# Patient Record
Sex: Female | Born: 1990
Health system: Southern US, Community
[De-identification: ages and names within clinical notes are randomized; demographics above are authoritative.]

## PROBLEM LIST (undated history)

## (undated) DIAGNOSIS — N979 Female infertility, unspecified: Secondary | ICD-10-CM

## (undated) DIAGNOSIS — E282 Polycystic ovarian syndrome: Secondary | ICD-10-CM

## (undated) DIAGNOSIS — J45909 Unspecified asthma, uncomplicated: Secondary | ICD-10-CM

## (undated) DIAGNOSIS — T883XXA Malignant hyperthermia due to anesthesia, initial encounter: Secondary | ICD-10-CM

## (undated) DIAGNOSIS — Z8489 Family history of other specified conditions: Secondary | ICD-10-CM

## (undated) DIAGNOSIS — M419 Scoliosis, unspecified: Secondary | ICD-10-CM

## (undated) DIAGNOSIS — Z87442 Personal history of urinary calculi: Secondary | ICD-10-CM

## (undated) DIAGNOSIS — Z8759 Personal history of other complications of pregnancy, childbirth and the puerperium: Secondary | ICD-10-CM

## (undated) DIAGNOSIS — J189 Pneumonia, unspecified organism: Secondary | ICD-10-CM

## (undated) HISTORY — DX: Malignant hyperthermia due to anesthesia, initial encounter: T88.3XXA

## (undated) HISTORY — DX: Female infertility, unspecified: N97.9

## (undated) HISTORY — PX: POLYPECTOMY: SHX149

## (undated) HISTORY — PX: WISDOM TOOTH EXTRACTION: SHX21

## (undated) HISTORY — DX: Pneumonia, unspecified organism: J18.9

## (undated) HISTORY — DX: Family history of other specified conditions: Z84.89

## (undated) HISTORY — DX: Scoliosis, unspecified: M41.9

---
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
## 2011-04-12 ENCOUNTER — Ambulatory Visit (INDEPENDENT_AMBULATORY_CARE_PROVIDER_SITE_OTHER): Payer: 59 | Admitting: Anesthesiology

## 2011-04-12 DIAGNOSIS — Z23 Encounter for immunization: Secondary | ICD-10-CM

## 2011-06-08 ENCOUNTER — Ambulatory Visit (INDEPENDENT_AMBULATORY_CARE_PROVIDER_SITE_OTHER): Payer: 59 | Admitting: Women's Health

## 2011-06-08 ENCOUNTER — Encounter: Payer: Self-pay | Admitting: Women's Health

## 2011-06-08 DIAGNOSIS — N766 Ulceration of vulva: Secondary | ICD-10-CM

## 2011-06-08 DIAGNOSIS — N390 Urinary tract infection, site not specified: Secondary | ICD-10-CM

## 2011-06-08 DIAGNOSIS — R112 Nausea with vomiting, unspecified: Secondary | ICD-10-CM

## 2011-06-08 DIAGNOSIS — J111 Influenza due to unidentified influenza virus with other respiratory manifestations: Secondary | ICD-10-CM

## 2011-06-08 DIAGNOSIS — N949 Unspecified condition associated with female genital organs and menstrual cycle: Secondary | ICD-10-CM

## 2011-06-08 DIAGNOSIS — R102 Pelvic and perineal pain: Secondary | ICD-10-CM

## 2011-06-08 LAB — URINALYSIS W MICROSCOPIC + REFLEX CULTURE
Casts: NONE SEEN
Crystals: NONE SEEN
Glucose, UA: NEGATIVE mg/dL
Nitrite: NEGATIVE
Specific Gravity, Urine: 1.03 (ref 1.005–1.030)
pH: 5.5 (ref 5.0–8.0)

## 2011-06-08 LAB — WET PREP FOR TRICH, YEAST, CLUE: Yeast Wet Prep HPF POC: NONE SEEN

## 2011-06-08 MED ORDER — PROMETHAZINE HCL 25 MG PO TABS: 25.0000 mg | ORAL_TABLET | Freq: Four times a day (QID) | ORAL | Status: AC | PRN

## 2011-06-08 MED ORDER — SULFAMETHOXAZOLE-TRIMETHOPRIM 400-80 MG PO TABS: 1.0000 | ORAL_TABLET | Freq: Two times a day (BID) | ORAL | Status: AC

## 2011-06-08 MED ORDER — OSELTAMIVIR PHOSPHATE 75 MG PO CAPS: 75.0000 mg | ORAL_CAPSULE | Freq: Two times a day (BID) | ORAL | Status: AC

## 2011-06-08 NOTE — Progress Notes (Signed)
Patient ID: LYZA HOUSEWORTH, female   DOB: [birth year], 21 y.o.   MRN: [id number] Presents with numerous symptoms. States is having pain and burning with urination for 2 days. Denies any pain at the end of the stream. Also states has a sore bump on the labia. Nausea and vomiting, fever, cough, headache and generally feeling poor for 2 days. One partner first for both. Contraceptives on Loestrin 1/20 without a problem.  Exam: Appears ill, external genitalia has a small ulcerated area on the left labia -HSV cultures taken and is pending. Hymenal tag, speculum exam  moderate amount of  white discharge without an odor or vaginal erythema, wet prep negative. Bimanual no CMT or adnexal fullness or tenderness.  UA positive for 3-6 WBCs, 7-10 RBCs, few bacteria.  Flu Questionable HSV lesion UTI  Plan: Tamiflu 75 by mouth twice a day for  5 days, Septra DS one by mouth twice a day for 3 days, Phenergan 25 every 8 hours when necessary for nausea precautions were reviewed. Reviewed importance of increasing rest, clear fluids, Motrin as needed for fever, call if no relief in several days. Urine culture pending.

## 2011-06-13 ENCOUNTER — Telehealth: Payer: Self-pay | Admitting: Women's Health

## 2011-06-13 DIAGNOSIS — B009 Herpesviral infection, unspecified: Secondary | ICD-10-CM

## 2011-06-13 MED ORDER — VALACYCLOVIR HCL 500 MG PO TABS: 500.0000 mg | ORAL_TABLET | Freq: Two times a day (BID) | ORAL | Status: AC

## 2011-06-13 NOTE — Telephone Encounter (Signed)
Telephone call to review HSV culture positive. First partner both, encourage to review with partner to be sure first partner. Reviewed medication Valtrex to take twice daily if ever feels like she is getting an outbreak. Instructed to call if questions or problems.

## 2011-06-14 ENCOUNTER — Telehealth: Payer: Self-pay | Admitting: Women's Health

## 2011-06-14 NOTE — Telephone Encounter (Signed)
Telephone call from patient. Partner had told her she was his first partner but after reviewing positive HSV results patient states partner has had one other partner. Instructed for him to have a STD screen or for her to return to the office for STD screening.

## 2011-08-10 ENCOUNTER — Other Ambulatory Visit: Payer: Self-pay | Admitting: Women's Health

## 2011-09-27 ENCOUNTER — Telehealth: Payer: Self-pay | Admitting: *Deleted

## 2011-09-27 ENCOUNTER — Encounter: Payer: Self-pay | Admitting: Women's Health

## 2011-09-27 ENCOUNTER — Ambulatory Visit (INDEPENDENT_AMBULATORY_CARE_PROVIDER_SITE_OTHER): Payer: 59 | Admitting: Women's Health

## 2011-09-27 VITALS — BP 100/70 | Ht 66.75 in | Wt 136.0 lb

## 2011-09-27 DIAGNOSIS — IMO0001 Reserved for inherently not codable concepts without codable children: Secondary | ICD-10-CM

## 2011-09-27 DIAGNOSIS — Z01419 Encounter for gynecological examination (general) (routine) without abnormal findings: Secondary | ICD-10-CM

## 2011-09-27 DIAGNOSIS — Z309 Encounter for contraceptive management, unspecified: Secondary | ICD-10-CM

## 2011-09-27 MED ORDER — NORETHINDRONE ACET-ETHINYL EST 1-20 MG-MCG PO TABS: 1.0000 | ORAL_TABLET | Freq: Every day | ORAL | Status: DC

## 2011-09-27 NOTE — Telephone Encounter (Signed)
Telephone call will check Costs, and decide if she wants to pursue. Also reviewed importance of getting enough rest, healthy diet to prevent illness

## 2011-09-27 NOTE — Telephone Encounter (Signed)
Pt was seen today and given Pneumovax vaccine Rx, pt said insurance doesn't cover the medication. Pt would like to know what should she do next? Please advise

## 2011-09-27 NOTE — Patient Instructions (Signed)
Health Maintenance, 20- to 21-Year-Old SCHOOL PERFORMANCE After high school completion, the Ayeisha Lindenberger adult may be attending college, technical or vocational school, or entering the military or the work force. SOCIAL AND EMOTIONAL DEVELOPMENT The Cathy Ropp adult establishes adult relationships and explores sexual identity. Jaecob Lowden adults may be living at home or in a college dorm or apartment. Increasing independence is important with Kasch Borquez adults. Throughout adolescence, teens should assume responsibility of their own health care. IMMUNIZATIONS Most Esaw Knippel adults should be fully vaccinated. A booster dose of Tdap (tetanus, diphtheria, and pertussis, or "whooping cough"), a dose of meningococcal vaccine to protect against a certain type of bacterial meningitis, hepatitis A, human papillomarvirus (HPV), chickenpox, or measles vaccines may be indicated, if not given at an earlier age. Annual influenza or "flu" vaccination should be considered during flu season.  TESTING Annual screening for vision and hearing problems is recommended. Vision should be screened objectively at least once between 20 and 21 years of age. The Jentzen Minasyan adult may be screened for anemia or tuberculosis. Matelyn Antonelli adults should have a blood test to check for high cholesterol during this time period. Tayquan Gassman adults should be screened for use of alcohol and drugs. If the Rayvin Abid adult is sexually active, screening for sexually transmitted infections, pregnancy, or HIV may be performed. Screening for cervical cancer should be performed within 3 years of beginning sexual activity. NUTRITION AND ORAL HEALTH  Adequate calcium intake is important. Consume 3 servings of low-fat milk and dairy products daily. For those who do not drink milk or consume dairy products, calcium enriched foods, such as juice, bread, or cereal, dark, leafy greens, or canned fish are alternate sources of calcium.   Drink plenty of water. Limit fruit juice to 8 to 12 ounces per day.  Avoid sugary beverages or sodas.   Discourage skipping meals, especially breakfast. Teens should eat a good variety of vegetables and fruits, as well as lean meats.   Avoid high fat, high salt, and high sugar foods, such as candy, chips, and cookies.   Encourage Deslyn Cavenaugh adults to participate in meal planning and preparation.   Eat meals together as a family whenever possible. Encourage conversation at mealtime.   Limit fast food choices and eating out at restaurants.   Brush teeth twice a day and floss.   Schedule dental exams twice a year.  SLEEP Regular sleep habits are important. PHYSICAL, SOCIAL, AND EMOTIONAL DEVELOPMENT  One hour of regular physical activity daily is recommended. Continue to participate in sports.   Encourage Lavance Beazer adults to develop their own interests and consider community service or volunteerism.   Provide guidance to the Kerri Asche adult in making decisions about college and work plans.   Make sure that Zoe Goonan adults know that they should never be in a situation that makes them uncomfortable, and they should tell partners if they do not want to engage in sexual activity.   Talk to the Brittan Mapel adult about body image. Eating disorders may be noted at this time. Kastiel Simonian adults may also be concerned about being overweight. Monitor the Jeffrie Lofstrom adult for weight gain or loss.   Mood disturbances, depression, anxiety, alcoholism, or attention problems may be noted in Yossef Gilkison adults. Talk to the caregiver if there are concerns about mental illness.   Negotiate limit setting and independent decision making.   Encourage the Vernelle Wisner adult to handle conflict without physical violence.   Avoid loud noises which may impair hearing.   Limit television and computer time to 2 hours per day.   Individuals who engage in excessive sedentary activity are more likely to become overweight.  RISK BEHAVIORS  Sexually active Norman Bier adults need to take precautions against pregnancy and sexually  transmitted infections. Talk to Zaxton Angerer adults about contraception.   Provide a tobacco-free and drug-free environment for the Domingue Coltrain adult. Talk to the Braxston Quinter adult about drug, tobacco, and alcohol use among friends or at friends' homes. Make sure the Reggie Welge adult knows that smoking tobacco or marijuana and taking drugs have health consequences and may impact brain development.   Teach the Fallou Hulbert adult about appropriate use of over-the-counter or prescription medicines.   Establish guidelines for driving and for riding with friends.   Talk to Attikus Bartoszek adults about the risks of drinking and driving or boating. Encourage the Jeffie Widdowson adult to call you if he or she or friends have been drinking or using drugs.   Remind Bronsen Serano adults to wear seat belts at all times in cars and life vests in boats.   Tamya Denardo adults should always wear a properly fitted helmet when they are riding a bicycle.   Use caution with all-terrain vehicles (ATVs) or other motorized vehicles.   Do not keep handguns in the home. (If you do, the gun and ammunition should be locked separately and out of the Zoe Goonan adult's access.)   Equip your home with smoke detectors and change the batteries regularly. Make sure all family members know the fire escape plans for your home.   Teach Leonila Speranza adults not to swim alone and not to dive in shallow water.   All individuals should wear sunscreen that protects against UVA and UVB light with at least a sun protection factor (SPF) of 30 when out in the sun. This minimizes sun burning.  WHAT'S NEXT? Mikkel Charrette adults should visit their pediatrician or family physician yearly. By Arron Tetrault adulthood, health care should be transitioned to a family physician or internal medicine specialist. Sexually active females may want to begin annual physical exams with a gynecologist. Document Released: 07/19/2006 Document Revised: 04/12/2011 Document Reviewed: 08/08/2006 ExitCare Patient Information 2012 [employer]. 

## 2011-09-27 NOTE — Progress Notes (Signed)
Felicia Woodard [birth year] [id number]    History:    The patient presents for annual exam.  Regular monthly 4 days cycle on Loestrin 1/20. Same partner, gardasil series completed in 2012.   Past medical history, past surgical history, family history and social history were all reviewed and documented in the EPIC chart. 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 Had pneumonia January of 2013. History of kidney stone, has mild scoliosis.   ROS:  A  ROS was performed and pertinent positives and negatives are included in the history.  Exam:  There were no vitals filed for this visit.  General appearance:  Normal Head/Neck:  Normal, without cervical or supraclavicular adenopathy. Thyroid:  Symmetrical, normal in size, without palpable masses or nodularity. Respiratory  Effort:  Normal  Auscultation:  Clear without wheezing or rhonchi Cardiovascular  Auscultation:  Regular rate, without rubs, murmurs or gallops  Edema/varicosities:  Not grossly evident Abdominal  Soft,nontender, without masses, guarding or rebound.  Liver/spleen:  No organomegaly noted  Hernia:  None appreciated  Skin  Inspection:  Grossly normal  Palpation:  Grossly normal Neurologic/psychiatric  Orientation:  Normal with appropriate conversation.  Mood/affect:  Normal  Genitourinary    Breasts: Examined lying and sitting.     Right: Without masses, retractions, discharge or axillary adenopathy.     Left: Without masses, retractions, discharge or axillary adenopathy.   Inguinal/mons:  Normal without inguinal adenopathy  External genitalia:  Normal  BUS/Urethra/Skene's glands:  Normal  Bladder:  Normal  Vagina:  Normal  Cervix:  Normal  Uterus:  normal in size, shape and contour.  Midline and mobile  Adnexa/parametria:     Rt: Without masses or tenderness.   Lt: Without masses or tenderness.  Anus and perineum: Normal  Digital rectal exam:   Assessment/Plan:  21 y.o. SWF G0  for annual exam with  no complaints.     Normal GYN exam on Loestrin 1/20  Plan: Loestrin 1/20 prescription, proper use, slight risk for blood clots and strokes reviewed, condoms encouraged until permanent partner. SBE's, exercise, calcium rich diet, MVI daily encouraged. Pneumovax vaccine reviewed in light of pneumonia history January 2013. Will followup with primary care. CBC, UAHarrington Challenger WHNP, 12:07 PM 09/27/2011

## 2011-09-28 LAB — CBC WITH DIFFERENTIAL/PLATELET
Basophils Absolute: 0.1 10*3/uL (ref 0.0–0.1)
Basophils Relative: 1 % (ref 0–1)
Eosinophils Absolute: 0.2 10*3/uL (ref 0.0–0.7)
Eosinophils Relative: 4 % (ref 0–5)
HCT: 41.4 % (ref 36.0–46.0)
Hemoglobin: 14.2 g/dL (ref 12.0–15.0)
Lymphocytes Relative: 33 % (ref 12–46)
Lymphs Abs: 2.1 10*3/uL (ref 0.7–4.0)
MCH: 30.2 pg (ref 26.0–34.0)
MCV: 88.1 fL (ref 78.0–100.0)
Monocytes Relative: 5 % (ref 3–12)
Platelets: 349 10*3/uL (ref 150–400)
RBC: 4.7 MIL/uL (ref 3.87–5.11)
WBC: 6.4 10*3/uL (ref 4.0–10.5)

## 2011-09-28 LAB — URINALYSIS W MICROSCOPIC + REFLEX CULTURE
Bacteria, UA: NONE SEEN
Bilirubin Urine: NEGATIVE
Protein, ur: NEGATIVE mg/dL
Squamous Epithelial / LPF: NONE SEEN
Urobilinogen, UA: 0.2 mg/dL (ref 0.0–1.0)

## 2011-10-11 ENCOUNTER — Other Ambulatory Visit: Payer: Self-pay | Admitting: Women's Health

## 2012-10-01 ENCOUNTER — Ambulatory Visit (INDEPENDENT_AMBULATORY_CARE_PROVIDER_SITE_OTHER): Payer: 59 | Admitting: Women's Health

## 2012-10-01 ENCOUNTER — Other Ambulatory Visit (HOSPITAL_COMMUNITY)
Admission: RE | Admit: 2012-10-01 | Discharge: 2012-10-01 | Disposition: A | Discharge status: Home / Self Care | Payer: 59 | Source: Ambulatory Visit | Attending: Women's Health | Admitting: Women's Health

## 2012-10-01 ENCOUNTER — Encounter: Payer: Self-pay | Admitting: Women's Health

## 2012-10-01 VITALS — BP 116/64 | Ht 67.25 in | Wt 147.0 lb

## 2012-10-01 DIAGNOSIS — Z01419 Encounter for gynecological examination (general) (routine) without abnormal findings: Secondary | ICD-10-CM | POA: Insufficient documentation

## 2012-10-01 DIAGNOSIS — Z309 Encounter for contraceptive management, unspecified: Secondary | ICD-10-CM

## 2012-10-01 DIAGNOSIS — IMO0001 Reserved for inherently not codable concepts without codable children: Secondary | ICD-10-CM

## 2012-10-01 LAB — CBC WITH DIFFERENTIAL/PLATELET
Hemoglobin: 13.2 g/dL (ref 12.0–15.0)
Lymphocytes Relative: 29 % (ref 12–46)
Lymphs Abs: 1.4 10*3/uL (ref 0.7–4.0)
MCH: 30.3 pg (ref 26.0–34.0)
Monocytes Relative: 8 % (ref 3–12)
Neutro Abs: 2.9 10*3/uL (ref 1.7–7.7)
Neutrophils Relative %: 59 % (ref 43–77)
RBC: 4.36 MIL/uL (ref 3.87–5.11)
WBC: 4.9 10*3/uL (ref 4.0–10.5)

## 2012-10-01 MED ORDER — NORETHINDRONE ACET-ETHINYL EST 1-20 MG-MCG PO TABS: 1.0000 | ORAL_TABLET | Freq: Every day | ORAL | Status: DC

## 2012-10-01 NOTE — Progress Notes (Signed)
Felicia Woodard 04-17-1991 [id number]    History:    The patient presents for annual exam.  Contraceptives on Loestrin 1/20 without problem. Gardasil series completed 2012. History of pneumonia February 2013, was hospitalized. History of a kidney stone.   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 Getting married next week, fiance youth minister.    ROS:  A  ROS was performed and pertinent positives and negatives are included in the history.  Exam:  Filed Vitals:   10/01/12 1024  BP: 116/64    General appearance:  Normal Head/Neck:  Normal, without cervical or supraclavicular adenopathy. Thyroid:  Symmetrical, normal in size, without palpable masses or nodularity. Respiratory  Effort:  Normal  Auscultation:  Clear without wheezing or rhonchi Cardiovascular  Auscultation:  Regular rate, without rubs, murmurs or gallops  Edema/varicosities:  Not grossly evident Abdominal  Soft,nontender, without masses, guarding or rebound.  Liver/spleen:  No organomegaly noted  Hernia:  None appreciated  Skin  Inspection:  Grossly normal  Palpation:  Grossly normal Neurologic/psychiatric  Orientation:  Normal with appropriate conversation.  Mood/affect:  Normal  Genitourinary    Breasts: Examined lying and sitting.     Right: Without masses, retractions, discharge or axillary adenopathy.     Left: Without masses, retractions, discharge or axillary adenopathy.   Inguinal/mons:  Normal without inguinal adenopathy  External genitalia:  Normal  BUS/Urethra/Skene's glands:  Normal  Bladder:  Normal  Vagina:  Normal  Cervix:  Normal  Uterus:   normal in size, shape and contour.  Midline and mobile  Adnexa/parametria:     Rt: Without masses or tenderness.   Lt: Without masses or tenderness.  Anus and  perineum: Normal   Assessment/Plan:  22 y.o. SWF G0 for annual exam with no complaints.  Normal GYN exam on OC's  Plan: Loestrin 1/20 prescription, proper use, slight risk for blood clots and strokes reviewed. SBE's, regular exercise, calcium rich diet, MVI daily encouraged. CBC, UA, Pap.     Harrington Challenger WHNP, 11:12 AM 10/01/2012

## 2012-10-01 NOTE — Addendum Note (Signed)
Addended by: Richardson Chiquito on: 10/01/2012 11:23 AM   Modules accepted: Orders

## 2012-10-01 NOTE — Patient Instructions (Addendum)
Health Maintenance, 18- to 21-Year-Old SCHOOL PERFORMANCE After high school completion, the young adult may be attending college, technical or vocational school, or entering the military or the work force. SOCIAL AND EMOTIONAL DEVELOPMENT The young adult establishes adult relationships and explores sexual identity. Young adults may be living at home or in a college dorm or apartment. Increasing independence is important with young adults. Throughout adolescence, teens should assume responsibility of their own health care. IMMUNIZATIONS Most young adults should be fully vaccinated. A booster dose of Tdap (tetanus, diphtheria, and pertussis, or "whooping cough"), a dose of meningococcal vaccine to protect against a certain type of bacterial meningitis, hepatitis A, human papillomarvirus (HPV), chickenpox, or measles vaccines may be indicated, if not given at an earlier age. Annual influenza or "flu" vaccination should be considered during flu season.  TESTING Annual screening for vision and hearing problems is recommended. Vision should be screened objectively at least once between 18 and 21 years of age. The young adult may be screened for anemia or tuberculosis. Young adults should have a blood test to check for high cholesterol during this time period. Young adults should be screened for use of alcohol and drugs. If the young adult is sexually active, screening for sexually transmitted infections, pregnancy, or HIV may be performed. Screening for cervical cancer should be performed within 3 years of beginning sexual activity. NUTRITION AND ORAL HEALTH  Adequate calcium intake is important. Consume 3 servings of low-fat milk and dairy products daily. For those who do not drink milk or consume dairy products, calcium enriched foods, such as juice, bread, or cereal, dark, leafy greens, or canned fish are alternate sources of calcium.  Drink plenty of water. Limit fruit juice to 8 to 12 ounces per day.  Avoid sugary beverages or sodas.  Discourage skipping meals, especially breakfast. Teens should eat a good variety of vegetables and fruits, as well as lean meats.  Avoid high fat, high salt, and high sugar foods, such as candy, chips, and cookies.  Encourage young adults to participate in meal planning and preparation.  Eat meals together as a family whenever possible. Encourage conversation at mealtime.  Limit fast food choices and eating out at restaurants.  Brush teeth twice a day and floss.  Schedule dental exams twice a year. SLEEP Regular sleep habits are important. PHYSICAL, SOCIAL, AND EMOTIONAL DEVELOPMENT  One hour of regular physical activity daily is recommended. Continue to participate in sports.  Encourage young adults to develop their own interests and consider community service or volunteerism.  Provide guidance to the young adult in making decisions about college and work plans.  Make sure that young adults know that they should never be in a situation that makes them uncomfortable, and they should tell partners if they do not want to engage in sexual activity.  Talk to the young adult about body image. Eating disorders may be noted at this time. Young adults may also be concerned about being overweight. Monitor the young adult for weight gain or loss.  Mood disturbances, depression, anxiety, alcoholism, or attention problems may be noted in young adults. Talk to the caregiver if there are concerns about mental illness.  Negotiate limit setting and independent decision making.  Encourage the young adult to handle conflict without physical violence.  Avoid loud noises which may impair hearing.  Limit television and computer time to 2 hours per day. Individuals who engage in excessive sedentary activity are more likely to become overweight. RISK BEHAVIORS  Sexually active   young adults need to take precautions against pregnancy and sexually transmitted  infections. Talk to young adults about contraception.  Provide a tobacco-free and drug-free environment for the young adult. Talk to the young adult about drug, tobacco, and alcohol use among friends or at friends' homes. Make sure the young adult knows that smoking tobacco or marijuana and taking drugs have health consequences and may impact brain development.  Teach the young adult about appropriate use of over-the-counter or prescription medicines.  Establish guidelines for driving and for riding with friends.  Talk to young adults about the risks of drinking and driving or boating. Encourage the young adult to call you if he or she or friends have been drinking or using drugs.  Remind young adults to wear seat belts at all times in cars and life vests in boats.  Young adults should always wear a properly fitted helmet when they are riding a bicycle.  Use caution with all-terrain vehicles (ATVs) or other motorized vehicles.  Do not keep handguns in the home. (If you do, the gun and ammunition should be locked separately and out of the young adult's access.)  Equip your home with smoke detectors and change the batteries regularly. Make sure all family members know the fire escape plans for your home.  Teach young adults not to swim alone and not to dive in shallow water.  All individuals should wear sunscreen that protects against UVA and UVB light with at least a sun protection factor (SPF) of 30 when out in the sun. This minimizes sun burning. WHAT'S NEXT? Young adults should visit their pediatrician or family physician yearly. By young adulthood, health care should be transitioned to a family physician or internal medicine specialist. Sexually active females may want to begin annual physical exams with a gynecologist. Document Released: 07/19/2006 Document Revised: 07/16/2011 Document Reviewed: 08/08/2006 ExitCare Patient Information 2014 [employer].  

## 2012-10-02 LAB — URINALYSIS W MICROSCOPIC + REFLEX CULTURE
Casts: NONE SEEN
Glucose, UA: NEGATIVE mg/dL
Leukocytes, UA: NEGATIVE
Nitrite: NEGATIVE
Specific Gravity, Urine: 1.022 (ref 1.005–1.030)
pH: 5.5 (ref 5.0–8.0)

## 2013-08-31 ENCOUNTER — Other Ambulatory Visit: Payer: Self-pay | Admitting: Women's Health

## 2013-10-02 ENCOUNTER — Encounter: Payer: Self-pay | Admitting: Women's Health

## 2013-10-02 ENCOUNTER — Ambulatory Visit (INDEPENDENT_AMBULATORY_CARE_PROVIDER_SITE_OTHER): Payer: 59 | Admitting: Women's Health

## 2013-10-02 VITALS — BP 124/70 | Ht 66.75 in | Wt 157.0 lb

## 2013-10-02 DIAGNOSIS — A609 Anogenital herpesviral infection, unspecified: Secondary | ICD-10-CM

## 2013-10-02 DIAGNOSIS — N898 Other specified noninflammatory disorders of vagina: Secondary | ICD-10-CM

## 2013-10-02 DIAGNOSIS — R350 Frequency of micturition: Secondary | ICD-10-CM

## 2013-10-02 DIAGNOSIS — Z01419 Encounter for gynecological examination (general) (routine) without abnormal findings: Secondary | ICD-10-CM

## 2013-10-02 DIAGNOSIS — Z309 Encounter for contraceptive management, unspecified: Secondary | ICD-10-CM

## 2013-10-02 DIAGNOSIS — B3731 Acute candidiasis of vulva and vagina: Secondary | ICD-10-CM

## 2013-10-02 DIAGNOSIS — IMO0001 Reserved for inherently not codable concepts without codable children: Secondary | ICD-10-CM

## 2013-10-02 DIAGNOSIS — B009 Herpesviral infection, unspecified: Secondary | ICD-10-CM

## 2013-10-02 DIAGNOSIS — B373 Candidiasis of vulva and vagina: Secondary | ICD-10-CM

## 2013-10-02 LAB — CBC WITH DIFFERENTIAL/PLATELET
BASOS ABS: 0 10*3/uL (ref 0.0–0.1)
Basophils Relative: 0 % (ref 0–1)
EOS ABS: 0.1 10*3/uL (ref 0.0–0.7)
EOS PCT: 1 % (ref 0–5)
HCT: 40.7 % (ref 36.0–46.0)
Hemoglobin: 13.8 g/dL (ref 12.0–15.0)
LYMPHS PCT: 10 % — AB (ref 12–46)
Lymphs Abs: 1.1 10*3/uL (ref 0.7–4.0)
MCH: 29.5 pg (ref 26.0–34.0)
MCHC: 33.9 g/dL (ref 30.0–36.0)
MCV: 87 fL (ref 78.0–100.0)
Monocytes Absolute: 0.7 10*3/uL (ref 0.1–1.0)
Monocytes Relative: 6 % (ref 3–12)
Neutro Abs: 9.4 10*3/uL — ABNORMAL HIGH (ref 1.7–7.7)
Neutrophils Relative %: 83 % — ABNORMAL HIGH (ref 43–77)
PLATELETS: 293 10*3/uL (ref 150–400)
RBC: 4.68 MIL/uL (ref 3.87–5.11)
RDW: 13.1 % (ref 11.5–15.5)
WBC: 11.3 10*3/uL — AB (ref 4.0–10.5)

## 2013-10-02 LAB — WET PREP FOR TRICH, YEAST, CLUE
Clue Cells Wet Prep HPF POC: NONE SEEN
TRICH WET PREP: NONE SEEN

## 2013-10-02 LAB — URINALYSIS W MICROSCOPIC + REFLEX CULTURE
Casts: NONE SEEN
Crystals: NONE SEEN
GLUCOSE, UA: NEGATIVE mg/dL
Ketones, ur: 40 mg/dL — AB
Nitrite: NEGATIVE
PROTEIN: 30 mg/dL — AB
Specific Gravity, Urine: >1.03 — ABNORMAL HIGH (ref 1.005–1.030)
UROBILINOGEN UA: 0.2 mg/dL (ref 0.0–1.0)
pH: 5.5 (ref 5.0–8.0)

## 2013-10-02 MED ORDER — SULFAMETHOXAZOLE-TRIMETHOPRIM 800-160 MG PO TABS: 1.0000 | ORAL_TABLET | Freq: Two times a day (BID) | ORAL | Status: DC

## 2013-10-02 MED ORDER — FLUCONAZOLE 150 MG PO TABS: 150.0000 mg | ORAL_TABLET | Freq: Once | ORAL | Status: DC

## 2013-10-02 MED ORDER — NORETHINDRONE ACET-ETHINYL EST 1-20 MG-MCG PO TABS: ORAL_TABLET | ORAL | Status: DC

## 2013-10-02 MED ORDER — VALACYCLOVIR HCL 500 MG PO TABS: 500.0000 mg | ORAL_TABLET | Freq: Two times a day (BID) | ORAL | Status: DC

## 2013-10-02 NOTE — Progress Notes (Signed)
Felicia Woodard July 28, 1990 [id number]    History:    Presents for annual exam.  Regular monthly light cycle on Loestrin 1/20. Normal Pap history. 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 Reports upper respiratory symptoms, urinary frequency and increased vaginal discharge. States is not feeling well today. History of pneumonia 2013. Gardasil series completed.  Past medical history, past surgical history, family history and social history were all reviewed and documented in the EPIC chart. Preschool teacher.   ROS:  A  12 point ROS was performed and pertinent positives and negatives are included.  Exam:  Filed Vitals:   10/02/13 1402  BP: 124/70    General appearance:  Normal Thyroid:  Symmetrical, normal in size, without palpable masses or nodularity. Respiratory  Auscultation:  Clear without wheezing or rhonchi Cardiovascular  Auscultation:  Regular rate, without rubs, murmurs or gallops  Edema/varicosities:  Not grossly evident Abdominal  Soft,nontender, without masses, guarding or rebound.  Liver/spleen:  No organomegaly noted  Hernia:  None appreciated  Skin  Inspection:  Grossly normal   Breasts: Examined lying and sitting.     Right: Without masses, retractions, discharge or axillary adenopathy.     Left: Without masses, retractions, discharge or axillary adenopathy. Gentitourinary   Inguinal/mons:  Normal without inguinal adenopathy  External genitalia:  Normal, hymenal tag  BUS/Urethra/Skene's glands:  Normal  Vagina:  Moderate white discharge, wet prep positive for yeast  Cervix:  Normal  Uterus:   normal in size, shape and contour.  Midline and mobile  Adnexa/parametria:     Rt: Without masses or tenderness.   Lt: Without masses or tenderness.  Anus and perineum: Normal UA: Trace leukocytes, 3-6 WBCs, 11-20 rbc's, few bacteria   Assessment/Plan:  23 y.o.MWF G0  for annual exam.    Upper respiratory infection Urinary frequency/Rule out  UTI Yeast HSV-no outbreaks  Plan: Septra DS twice daily for 3 days #6. Reviewed may treat with upper respiratory and UTI. Loestrin 1/20 prescription, proper use given and reviewed slight risk for blood clots and strokes. Diflucan 150 mg, one dose with refill. Valtrex 500 twice daily for 3-5 days if needed. Prescription, proper use given and reviewed. SBE's, regular exercise, calcium rich diet, MVI daily encouraged. CBC, rubella titer, UA. Pap normal 2014, new screening guidelines reviewed.  Note: This dictation was prepared with Dragon/digital dictation.  Any transcriptional errors that result are unintentional. Harrington Challenger [facility], 2:40 PM 10/02/2013

## 2013-10-02 NOTE — Patient Instructions (Signed)

## 2013-10-03 LAB — RUBELLA SCREEN: Rubella: 2.13 Index — ABNORMAL HIGH (ref <0.90)

## 2013-10-04 LAB — URINE CULTURE: Colony Count: 5000

## 2013-10-05 ENCOUNTER — Telehealth: Payer: Self-pay

## 2013-10-05 NOTE — Telephone Encounter (Signed)
[place]  She will research and call me back with pharmacy near her there.

## 2013-10-05 NOTE — Telephone Encounter (Signed)
Message copied by Keenan Bachelor on Mon Oct 05, 2013 11:15 AM ------      Message from: [place], Harriett Sine J      Created: Mon Oct 05, 2013  9:10 AM       Please call and inform urine culture was neg, ask if feeling bette if not let me know. Inform she is rubella positive. ------

## 2013-10-05 NOTE — Telephone Encounter (Signed)
Please call in a zpak, she has history of pneumonia, review you take for 5 days but works for 10,

## 2013-10-05 NOTE — Telephone Encounter (Signed)
Per your question below.  Patient says she is some better but still has symptoms.

## 2014-01-26 ENCOUNTER — Other Ambulatory Visit: Payer: Self-pay

## 2014-01-26 MED ORDER — NORETHINDRONE ACET-ETHINYL EST 1-20 MG-MCG PO TABS: ORAL_TABLET | ORAL | Status: DC

## 2014-09-29 ENCOUNTER — Telehealth: Payer: Self-pay | Admitting: *Deleted

## 2014-09-29 NOTE — Telephone Encounter (Signed)
Have her take a home U PT, schedule annual exam prior to moving.

## 2014-09-29 NOTE — Telephone Encounter (Signed)
Left the below on pt voicemail and told her per Lupita LeashDonna she can come on 10/01/14 at 2:00pm. Told her to call to confirm this with donna

## 2014-09-29 NOTE — Telephone Encounter (Signed)
Pt called stating her last LMP was in Jan 2016, stopped birth control pills in Nov. 2015. Is sexually active has not taken a UPT, states sometimes she has cramping but never a bleed. Pt annual is due this month, however she will be moving next month. Please advise

## 2014-10-01 ENCOUNTER — Encounter: Payer: Self-pay | Admitting: Women's Health

## 2014-10-08 ENCOUNTER — Encounter: Payer: Self-pay | Admitting: Women's Health

## 2014-10-08 ENCOUNTER — Ambulatory Visit (INDEPENDENT_AMBULATORY_CARE_PROVIDER_SITE_OTHER): Payer: BLUE CROSS/BLUE SHIELD | Admitting: Women's Health

## 2014-10-08 VITALS — BP 120/72 | Ht 67.0 in | Wt 170.4 lb

## 2014-10-08 DIAGNOSIS — Z01419 Encounter for gynecological examination (general) (routine) without abnormal findings: Secondary | ICD-10-CM | POA: Diagnosis not present

## 2014-10-08 LAB — CBC WITH DIFFERENTIAL/PLATELET
BASOS ABS: 0.1 10*3/uL (ref 0.0–0.1)
BASOS PCT: 1 % (ref 0–1)
EOS PCT: 3 % (ref 0–5)
Eosinophils Absolute: 0.2 10*3/uL (ref 0.0–0.7)
HEMATOCRIT: 41.5 % (ref 36.0–46.0)
HEMOGLOBIN: 14.2 g/dL (ref 12.0–15.0)
LYMPHS ABS: 2.9 10*3/uL (ref 0.7–4.0)
LYMPHS PCT: 37 % (ref 12–46)
MCH: 30.2 pg (ref 26.0–34.0)
MCHC: 34.2 g/dL (ref 30.0–36.0)
MCV: 88.3 fL (ref 78.0–100.0)
MONOS PCT: 7 % (ref 3–12)
MPV: 9.1 fL (ref 8.6–12.4)
Monocytes Absolute: 0.6 10*3/uL (ref 0.1–1.0)
Neutro Abs: 4.1 10*3/uL (ref 1.7–7.7)
Neutrophils Relative %: 52 % (ref 43–77)
Platelets: 339 10*3/uL (ref 150–400)
RBC: 4.7 MIL/uL (ref 3.87–5.11)
RDW: 12.6 % (ref 11.5–15.5)
WBC: 7.9 10*3/uL (ref 4.0–10.5)

## 2014-10-08 NOTE — Progress Notes (Signed)
Carmina MillerMadison D Dejarnett Oct 16, 1990 [id number]    History:    Presents for annual exam.  Regular monthly cycle until March amenorrheic until today, negative home UPT under increased stress maternal aunt died of ovarian cancer, process of moving tomorrow and changing jobs.  Normal Pap history. Gardasil series completed. HSV 2 rare outbreaks. Rubella positive.  Past medical history, past surgical history, family history and social history were all reviewed and documented in the EPIC chart. 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 2013 pneumonia, has had pneumonia vaccine. Father hypertension.  ROS:  A ROS was performed and pertinent positives and negatives are included.  Exam:  Filed Vitals:   10/08/14 1525  BP: 120/72    General appearance:  Normal Thyroid:  Symmetrical, normal in size, without palpable masses or nodularity. Respiratory  Auscultation:  Clear without wheezing or rhonchi Cardiovascular  Auscultation:  Regular rate, without rubs, murmurs or gallops  Edema/varicosities:  Not grossly evident Abdominal  Soft,nontender, without masses, guarding or rebound.  Liver/spleen:  No organomegaly noted  Hernia:  None appreciated  Skin  Inspection:  Grossly normal   Breasts: Examined lying and sitting.     Right: Without masses, retractions, discharge or axillary adenopathy.     Left: Without masses, retractions, discharge or axillary adenopathy. Gentitourinary   Inguinal/mons:  Normal without inguinal adenopathy  External genitalia:  Normal  BUS/Urethra/Skene's glands:  Normal  Vagina:  Normal  Cervix:  Normal  Uterus:   normal in size, shape and contour.  Midline and mobile  Adnexa/parametria:     Rt: Without masses or tenderness.   Lt: Without masses or tenderness.  Anus and perineum: Normal    Assessment/Plan:  24 y.o. SWF G0 for annual exam.  Monthly cycle/condoms Rubella positive HSV 2-rare outbreaks  Plan: SBE's, regular exercise, calcium  rich diet, safe pregnancy behaviors reviewed. Contemplating pregnancy next year. 15 pound weight gain, relates to frequent eating out, decreased exercise due to family commitments. CBC, UA. Pap normal 2014, new screening guidelines reviewed.   Harrington ChallengerYOUNG,Orie Baxendale J Culbers[facility] HospitalWHNP, 3:47 PM 10/08/2014

## 2014-10-08 NOTE — Patient Instructions (Signed)

## 2014-12-27 ENCOUNTER — Telehealth: Payer: Self-pay | Admitting: *Deleted

## 2014-12-27 NOTE — Telephone Encounter (Signed)
Pt called c/o no cycle since June, asked if was okay to try "something" unable to understand the message what pt wanted to try. I asked her to call me back to talk.

## 2014-12-28 NOTE — Telephone Encounter (Signed)
I called pt back and told her OV with nancy, no longer taking birth control pills, asked about an essential oil to help start cycle.

## 2015-10-20 ENCOUNTER — Ambulatory Visit (INDEPENDENT_AMBULATORY_CARE_PROVIDER_SITE_OTHER): Payer: Managed Care, Other (non HMO) | Admitting: Women's Health

## 2015-10-20 ENCOUNTER — Encounter: Payer: Self-pay | Admitting: Women's Health

## 2015-10-20 VITALS — BP 122/80 | Ht 67.0 in | Wt 160.0 lb

## 2015-10-20 DIAGNOSIS — N926 Irregular menstruation, unspecified: Secondary | ICD-10-CM | POA: Diagnosis not present

## 2015-10-20 DIAGNOSIS — Z01419 Encounter for gynecological examination (general) (routine) without abnormal findings: Secondary | ICD-10-CM | POA: Diagnosis not present

## 2015-10-20 LAB — TSH: TSH: 1.74 mIU/L

## 2015-10-20 LAB — CBC WITH DIFFERENTIAL/PLATELET
BASOS PCT: 1 %
Basophils Absolute: 58 cells/uL (ref 0–200)
Eosinophils Absolute: 174 cells/uL (ref 15–500)
Eosinophils Relative: 3 %
HEMATOCRIT: 39.4 % (ref 35.0–45.0)
HEMOGLOBIN: 13 g/dL (ref 11.7–15.5)
LYMPHS ABS: 2204 {cells}/uL (ref 850–3900)
LYMPHS PCT: 38 %
MCH: 29.7 pg (ref 27.0–33.0)
MCHC: 33 g/dL (ref 32.0–36.0)
MCV: 90.2 fL (ref 80.0–100.0)
MONO ABS: 348 {cells}/uL (ref 200–950)
MPV: 8.7 fL (ref 7.5–12.5)
Monocytes Relative: 6 %
Neutro Abs: 3016 cells/uL (ref 1500–7800)
Neutrophils Relative %: 52 %
Platelets: 332 10*3/uL (ref 140–400)
RBC: 4.37 MIL/uL (ref 3.80–5.10)
RDW: 13.1 % (ref 11.0–15.0)
WBC: 5.8 10*3/uL (ref 3.8–10.8)

## 2015-10-20 NOTE — Patient Instructions (Signed)
Health Maintenance, Female Adopting a healthy lifestyle and getting preventive care can go a long way to promote health and wellness. Talk with your health care provider about what schedule of regular examinations is right for you. This is a good chance for you to check in with your provider about disease prevention and staying healthy. In between checkups, there are plenty of things you can do on your own. Experts have done a lot of research about which lifestyle changes and preventive measures are most likely to keep you healthy. Ask your health care provider for more information. WEIGHT AND DIET  Eat a healthy diet  Be sure to include plenty of vegetables, fruits, low-fat dairy products, and lean protein.  Do not eat a lot of foods high in solid fats, added sugars, or salt.  Get regular exercise. This is one of the most important things you can do for your health.  Most adults should exercise for at least 150 minutes each week. The exercise should increase your heart rate and make you sweat (moderate-intensity exercise).  Most adults should also do strengthening exercises at least twice a week. This is in addition to the moderate-intensity exercise.  Maintain a healthy weight  Body mass index (BMI) is a measurement that can be used to identify possible weight problems. It estimates body fat based on height and weight. Your health care provider can help determine your BMI and help you achieve or maintain a healthy weight.  For females 20 years of age and older:   A BMI below 18.5 is considered underweight.  A BMI of 18.5 to 24.9 is normal.  A BMI of 25 to 29.9 is considered overweight.  A BMI of 30 and above is considered obese.  Watch levels of cholesterol and blood lipids  You should start having your blood tested for lipids and cholesterol at 24 years of age, then have this test every 5 years.  You may need to have your cholesterol levels checked more often if:  Your lipid  or cholesterol levels are high.  You are older than 24 years of age.  You are at high risk for heart disease.  CANCER SCREENING   Lung Cancer  Lung cancer screening is recommended for adults 55-80 years old who are at high risk for lung cancer because of a history of smoking.  A yearly low-dose CT scan of the lungs is recommended for people who:  Currently smoke.  Have quit within the past 15 years.  Have at least a 30-pack-year history of smoking. A pack year is smoking an average of one pack of cigarettes a day for 1 year.  Yearly screening should continue until it has been 15 years since you quit.  Yearly screening should stop if you develop a health problem that would prevent you from having lung cancer treatment.  Breast Cancer  Practice breast self-awareness. This means understanding how your breasts normally appear and feel.  It also means doing regular breast self-exams. Let your health care provider know about any changes, no matter how small.  If you are in your 20s or 30s, you should have a clinical breast exam (CBE) by a health care provider every 1-3 years as part of a regular health exam.  If you are 40 or older, have a CBE every year. Also consider having a breast X-ray (mammogram) every year.  If you have a family history of breast cancer, talk to your health care provider about genetic screening.  If you   are at high risk for breast cancer, talk to your health care provider about having an MRI and a mammogram every year.  Breast cancer gene (BRCA) assessment is recommended for women who have family members with BRCA-related cancers. BRCA-related cancers include:  Breast.  Ovarian.  Tubal.  Peritoneal cancers.  Results of the assessment will determine the need for genetic counseling and BRCA1 and BRCA2 testing. Cervical Cancer Your health care provider may recommend that you be screened regularly for cancer of the pelvic organs (ovaries, uterus, and  vagina). This screening involves a pelvic examination, including checking for microscopic changes to the surface of your cervix (Pap test). You may be encouraged to have this screening done every 3 years, beginning at age 21.  For women ages 30-65, health care providers may recommend pelvic exams and Pap testing every 3 years, or they may recommend the Pap and pelvic exam, combined with testing for human papilloma virus (HPV), every 5 years. Some types of HPV increase your risk of cervical cancer. Testing for HPV may also be done on women of any age with unclear Pap test results.  Other health care providers may not recommend any screening for nonpregnant women who are considered low risk for pelvic cancer and who do not have symptoms. Ask your health care provider if a screening pelvic exam is right for you.  If you have had past treatment for cervical cancer or a condition that could lead to cancer, you need Pap tests and screening for cancer for at least 20 years after your treatment. If Pap tests have been discontinued, your risk factors (such as having a new sexual partner) need to be reassessed to determine if screening should resume. Some women have medical problems that increase the chance of getting cervical cancer. In these cases, your health care provider may recommend more frequent screening and Pap tests. Colorectal Cancer  This type of cancer can be detected and often prevented.  Routine colorectal cancer screening usually begins at 24 years of age and continues through 24 years of age.  Your health care provider may recommend screening at an earlier age if you have risk factors for colon cancer.  Your health care provider may also recommend using home test kits to check for hidden blood in the stool.  A small camera at the end of a tube can be used to examine your colon directly (sigmoidoscopy or colonoscopy). This is done to check for the earliest forms of colorectal  cancer.  Routine screening usually begins at age 50.  Direct examination of the colon should be repeated every 5-10 years through 24 years of age. However, you may need to be screened more often if early forms of precancerous polyps or small growths are found. Skin Cancer  Check your skin from head to toe regularly.  Tell your health care provider about any new moles or changes in moles, especially if there is a change in a mole's shape or color.  Also tell your health care provider if you have a mole that is larger than the size of a pencil eraser.  Always use sunscreen. Apply sunscreen liberally and repeatedly throughout the day.  Protect yourself by wearing long sleeves, pants, a wide-brimmed hat, and sunglasses whenever you are outside. HEART DISEASE, DIABETES, AND HIGH BLOOD PRESSURE   High blood pressure causes heart disease and increases the risk of stroke. High blood pressure is more likely to develop in:  People who have blood pressure in the high end   of the normal range (130-139/85-89 mm Hg).  People who are overweight or obese.  People who are African American.  If you are 38-23 years of age, have your blood pressure checked every 3-5 years. If you are 61 years of age or older, have your blood pressure checked every year. You should have your blood pressure measured twice--once when you are at a hospital or clinic, and once when you are not at a hospital or clinic. Record the average of the two measurements. To check your blood pressure when you are not at a hospital or clinic, you can use:  An automated blood pressure machine at a pharmacy.  A home blood pressure monitor.  If you are between 45 years and 39 years old, ask your health care provider if you should take aspirin to prevent strokes.  Have regular diabetes screenings. This involves taking a blood sample to check your fasting blood sugar level.  If you are at a normal weight and have a low risk for diabetes,  have this test once every three years after 24 years of age.  If you are overweight and have a high risk for diabetes, consider being tested at a younger age or more often. PREVENTING INFECTION  Hepatitis B  If you have a higher risk for hepatitis B, you should be screened for this virus. You are considered at high risk for hepatitis B if:  You were born in a country where hepatitis B is common. Ask your health care provider which countries are considered high risk.  Your parents were born in a high-risk country, and you have not been immunized against hepatitis B (hepatitis B vaccine).  You have HIV or AIDS.  You use needles to inject street drugs.  You live with someone who has hepatitis B.  You have had sex with someone who has hepatitis B.  You get hemodialysis treatment.  You take certain medicines for conditions, including cancer, organ transplantation, and autoimmune conditions. Hepatitis C  Blood testing is recommended for:  Everyone born from [birth year] through 1964.  Anyone with known risk factors for hepatitis C. Sexually transmitted infections (STIs)  You should be screened for sexually transmitted infections (STIs) including gonorrhea and chlamydia if:  You are sexually active and are younger than 24 years of age.  You are older than 24 years of age and your health care provider tells you that you are at risk for this type of infection.  Your sexual activity has changed since you were last screened and you are at an increased risk for chlamydia or gonorrhea. Ask your health care provider if you are at risk.  If you do not have HIV, but are at risk, it may be recommended that you take a prescription medicine daily to prevent HIV infection. This is called pre-exposure prophylaxis (PrEP). You are considered at risk if:  You are sexually active and do not regularly use condoms or know the HIV status of your partner(s).  You take drugs by injection.  You are sexually  active with a partner who has HIV. Talk with your health care provider about whether you are at high risk of being infected with HIV. If you choose to begin PrEP, you should first be tested for HIV. You should then be tested every 3 months for as long as you are taking PrEP.  PREGNANCY   If you are premenopausal and you may become pregnant, ask your health care provider about preconception counseling.  If you may  become pregnant, take 400 to 800 micrograms (mcg) of folic acid every day.  If you want to prevent pregnancy, talk to your health care provider about birth control (contraception). OSTEOPOROSIS AND MENOPAUSE   Osteoporosis is a disease in which the bones lose minerals and strength with aging. This can result in serious bone fractures. Your risk for osteoporosis can be identified using a bone density scan.  If you are 61 years of age or older, or if you are at risk for osteoporosis and fractures, ask your health care provider if you should be screened.  Ask your health care provider whether you should take a calcium or vitamin D supplement to lower your risk for osteoporosis.  Menopause may have certain physical symptoms and risks.  Hormone replacement therapy may reduce some of these symptoms and risks. Talk to your health care provider about whether hormone replacement therapy is right for you.  HOME CARE INSTRUCTIONS   Schedule regular health, dental, and eye exams.  Stay current with your immunizations.   Do not use any tobacco products including cigarettes, chewing tobacco, or electronic cigarettes.  If you are pregnant, do not drink alcohol.  If you are breastfeeding, limit how much and how often you drink alcohol.  Limit alcohol intake to no more than 1 drink per day for nonpregnant women. One drink equals 12 ounces of beer, 5 ounces of wine, or 1 ounces of hard liquor.  Do not use street drugs.  Do not share needles.  Ask your health care provider for help if  you need support or information about quitting drugs.  Tell your health care provider if you often feel depressed.  Tell your health care provider if you have ever been abused or do not feel safe at home.   This information is not intended to replace advice given to you by your health care provider. Make sure you discuss any questions you have with your health care provider.   Document Released: 11/06/2010 Document Revised: 05/14/2014 Document Reviewed: 03/25/2013 Elsevier Interactive Patient Education [employer].

## 2015-10-20 NOTE — Addendum Note (Signed)
Addended by: Kem ParkinsonBARNES, Maxi Carreras on: 10/20/2015 11:15 AM   Modules accepted: Orders

## 2015-10-20 NOTE — Progress Notes (Signed)
Felicia MillerMadison D Woodard [birth year] [id number]    History:    Presents for annual exam.  Cycles every 1-3 months, desiring conception since September 2017. Having frequent intercourse. Reports husband healthy. Mother, sister history PCOS. Gardasil series completed. Normal Pap history. HSV 2 rare outbreaks.  Past medical history, past surgical history, family history and social history were all reviewed and documented in the EPIC chart. 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  ROS:  A ROS was performed and pertinent positives and negatives are included.  Exam:  Filed Vitals:   10/20/15 1038  BP: 122/80    General appearance:  Normal Thyroid:  Symmetrical, normal in size, without palpable masses or nodularity. Respiratory  Auscultation:  Clear without wheezing or rhonchi Cardiovascular  Auscultation:  Regular rate, without rubs, murmurs or gallops  Edema/varicosities:  Not grossly evident Abdominal  Soft,nontender, without masses, guarding or rebound.  Liver/spleen:  No organomegaly noted  Hernia:  None appreciated  Skin  Inspection:  Grossly normal   Breasts: Examined lying and sitting.     Right: Without masses, retractions, discharge or axillary adenopathy.     Left: Without masses, retractions, discharge or axillary adenopathy. Gentitourinary   Inguinal/mons:  Normal without inguinal adenopathy  External genitalia:  Normal  BUS/Urethra/Skene's glands:  Normal  Vagina:  Normal  Cervix:  Normal  Uterus:   normal in size, shape and contour.  Midline and mobile  Adnexa/parametria:     Rt: Without masses or tenderness.   Lt: Without masses or tenderness.  Anus and perineum: Normal    Assessment/Plan:  25 y.o. MWF G0 for annual exam.    Irregular cycles desiring conception HSV 2 - no outbreaks  Plan: CBC, TSH, prolactin, UA. We'll check ultrasound, progesterone day 22-25, FSH and estradiol day 3 of next cycle. Continue frequent intercourse, prenatal  vitamin daily, and healthy lifestyle. SBE's, exercise, calcium rich diet encouraged. Pap.   Harrington ChallengerYOUNG,NANCY J WHNP, 11:07 AM 10/20/2015

## 2015-10-21 ENCOUNTER — Encounter: Payer: Self-pay | Admitting: Women's Health

## 2015-10-21 LAB — PAP IG W/ RFLX HPV ASCU

## 2015-10-21 LAB — URINALYSIS W MICROSCOPIC + REFLEX CULTURE
BILIRUBIN URINE: NEGATIVE
CRYSTALS: NONE SEEN [HPF]
Casts: NONE SEEN [LPF]
Glucose, UA: NEGATIVE
Hgb urine dipstick: NEGATIVE
KETONES UR: NEGATIVE
Nitrite: NEGATIVE
PROTEIN: NEGATIVE
Specific Gravity, Urine: 1.023 (ref 1.001–1.035)
WBC UA: NONE SEEN WBC/HPF (ref <=5)
Yeast: NONE SEEN [HPF]
pH: 6.5 (ref 5.0–8.0)

## 2015-10-21 LAB — PROLACTIN: PROLACTIN: 10.5 ng/mL

## 2015-10-22 LAB — URINE CULTURE
Colony Count: NO GROWTH
ORGANISM ID, BACTERIA: NO GROWTH

## 2015-11-03 ENCOUNTER — Other Ambulatory Visit: Payer: Self-pay | Admitting: Women's Health

## 2015-11-03 ENCOUNTER — Encounter: Payer: Self-pay | Admitting: Women's Health

## 2015-11-03 ENCOUNTER — Ambulatory Visit (INDEPENDENT_AMBULATORY_CARE_PROVIDER_SITE_OTHER): Payer: Managed Care, Other (non HMO) | Admitting: Women's Health

## 2015-11-03 ENCOUNTER — Ambulatory Visit (INDEPENDENT_AMBULATORY_CARE_PROVIDER_SITE_OTHER): Payer: Managed Care, Other (non HMO)

## 2015-11-03 VITALS — BP 118/80 | Ht 67.0 in | Wt 160.0 lb

## 2015-11-03 DIAGNOSIS — R9389 Abnormal findings on diagnostic imaging of other specified body structures: Secondary | ICD-10-CM

## 2015-11-03 DIAGNOSIS — R938 Abnormal findings on diagnostic imaging of other specified body structures: Secondary | ICD-10-CM | POA: Diagnosis not present

## 2015-11-03 DIAGNOSIS — N926 Irregular menstruation, unspecified: Secondary | ICD-10-CM

## 2015-11-03 DIAGNOSIS — N839 Noninflammatory disorder of ovary, fallopian tube and broad ligament, unspecified: Secondary | ICD-10-CM

## 2015-11-03 DIAGNOSIS — N840 Polyp of corpus uteri: Secondary | ICD-10-CM

## 2015-11-03 DIAGNOSIS — N838 Other noninflammatory disorders of ovary, fallopian tube and broad ligament: Secondary | ICD-10-CM

## 2015-11-03 DIAGNOSIS — E282 Polycystic ovarian syndrome: Secondary | ICD-10-CM | POA: Diagnosis not present

## 2015-11-03 MED ORDER — METFORMIN HCL 500 MG PO TABS: 500.0000 mg | ORAL_TABLET | Freq: Two times a day (BID) | ORAL | Status: DC

## 2015-11-03 NOTE — Patient Instructions (Signed)
Polycystic Ovarian Syndrome  Polycystic ovarian syndrome (PCOS) is a common hormonal disorder among women of reproductive age. Most women with PCOS grow many small cysts on their ovaries. PCOS can cause problems with your periods and make it difficult to get pregnant. It can also cause an increased risk of miscarriage with pregnancy. If left untreated, PCOS can lead to serious health problems, such as diabetes and heart disease.  CAUSES  The cause of PCOS is not fully understood, but genetics may be a factor.  SIGNS AND SYMPTOMS   · Infrequent or no menstrual periods.    · Inability to get pregnant (infertility) because of not ovulating.    · Increased growth of hair on the face, chest, stomach, back, thumbs, thighs, or toes.    · Acne, oily skin, or dandruff.    · Pelvic pain.    · Weight gain or obesity, usually carrying extra weight around the waist.    · Type 2 diabetes.     · High cholesterol.    · High blood pressure.    · Female-pattern baldness or thinning hair.    · Patches of thickened and dark brown or black skin on the neck, arms, breasts, or thighs.    · Tiny excess flaps of skin (skin tags) in the armpits or neck area.    · Excessive snoring and having breathing stop at times while asleep (sleep apnea).    · Deepening of the voice.    · Gestational diabetes when pregnant.    DIAGNOSIS   There is no single test to diagnose PCOS.   · Your health care provider will:      Take a medical history.      Perform a pelvic exam.      Have ultrasonography done.      Check your female and female hormone levels.      Measure glucose or sugar levels in the blood.      Do other blood tests.    · If you are producing too many female hormones, your health care provider will make sure it is from PCOS. At the physical exam, your health care provider will want to evaluate the areas of increased hair growth. Try to allow natural hair growth for a few days before the visit.    · During a pelvic exam, the ovaries may be enlarged  or swollen because of the increased number of small cysts. This can be seen more easily by using vaginal ultrasonography or screening to examine the ovaries and lining of the uterus (endometrium) for cysts. The uterine lining may become thicker if you have not been having a regular period.    TREATMENT   Because there is no cure for PCOS, it needs to be managed to prevent problems. Treatments are based on your symptoms. Treatment is also based on whether you want to have a baby or whether you need contraception.   Treatment may include:   · Progesterone hormone to start a menstrual period.    · Birth control pills to make you have regular menstrual periods.    · Medicines to make you ovulate, if you want to get pregnant.    · Medicines to control your insulin.    · Medicine to control your blood pressure.    · Medicine and diet to control your high cholesterol and triglycerides in your blood.  · Medicine to reduce excessive hair growth.   · Surgery, making small holes in the ovary, to decrease the amount of female hormone production. This is done through a long, lighted tube (laparoscope) placed into the pelvis through a tiny incision in the lower abdomen.      HOME CARE INSTRUCTIONS  · Only take over-the-counter or prescription medicine as directed by your health care provider.  · Pay attention to the foods you eat and your activity levels. This can help reduce the effects of PCOS.    Keep your weight under control.    Eat foods that are low in carbohydrate and high in fiber.    Exercise regularly.  SEEK MEDICAL CARE IF:  · Your symptoms do not get better with medicine.  · You have new symptoms.     This information is not intended to replace advice given to you by your health care provider. Make sure you discuss any questions you have with your health care provider.     Document Released: 08/17/2004 Document Revised: 02/11/2013 Document Reviewed: 10/09/2012  Elsevier Interactive Patient Education ©2016 Elsevier  Inc.

## 2015-11-03 NOTE — Progress Notes (Signed)
Patient ID: Felicia Woodard, female   DOB: 07-26-1990, 25 y.o.   MRN: [id number] Presents for ultrasound. History of oligomenorrhea, sister and mother both with PCOS. Desiring conception, using no contraception for approximately one year.  Exam: Appears well. Ultrasound: T/V anteverted uterus homogeneous echo pattern. Noted prominent endometrium,  right ovary normal echo with numerous follicles increased ovarian volume 20.9 cc. Left ovary increased ovarian volume with thin-walled cystic mass reticular echo pattern 38 x 40 x 36 mm, 38 mm mean. Negative CFD. Negative cul-de-sac.   PCO S Questionable endometrial polyp Left ovary - probable hemorrhagic cyst-asymptomatic  Plan: Ultrasound reviewed with Dr. Lily PeerFernandez will schedule sonohysterogram after next cycle. Metformin 500 mg start with an tablet with meals daily increase to Staley with meals if tolerating well. Review goal is to get to 1000 twice daily. Continue prenatal vitamin daily, safe pregnancy behaviors reviewed.

## 2015-11-10 ENCOUNTER — Telehealth: Payer: Self-pay | Admitting: Gynecology

## 2015-11-10 NOTE — Telephone Encounter (Signed)
11/10/15-I LM VM cell for pt to let her know that she will have a $40 cop[facility] Ref #[id number]

## 2015-11-15 ENCOUNTER — Ambulatory Visit (INDEPENDENT_AMBULATORY_CARE_PROVIDER_SITE_OTHER): Payer: Managed Care, Other (non HMO)

## 2015-11-15 ENCOUNTER — Encounter: Payer: Self-pay | Admitting: Gynecology

## 2015-11-15 ENCOUNTER — Ambulatory Visit (INDEPENDENT_AMBULATORY_CARE_PROVIDER_SITE_OTHER): Payer: Managed Care, Other (non HMO) | Admitting: Gynecology

## 2015-11-15 DIAGNOSIS — E282 Polycystic ovarian syndrome: Secondary | ICD-10-CM

## 2015-11-15 DIAGNOSIS — N840 Polyp of corpus uteri: Secondary | ICD-10-CM

## 2015-11-15 DIAGNOSIS — R938 Abnormal findings on diagnostic imaging of other specified body structures: Secondary | ICD-10-CM

## 2015-11-15 DIAGNOSIS — N970 Female infertility associated with anovulation: Secondary | ICD-10-CM | POA: Insufficient documentation

## 2015-11-15 DIAGNOSIS — R9389 Abnormal findings on diagnostic imaging of other specified body structures: Secondary | ICD-10-CM

## 2015-11-15 HISTORY — DX: Polycystic ovarian syndrome: E28.2

## 2015-11-15 HISTORY — DX: Female infertility associated with anovulation: N97.0

## 2015-11-15 MED ORDER — CLOMIPHENE CITRATE 50 MG PO TABS: ORAL_TABLET | ORAL | Status: DC

## 2015-11-15 NOTE — Progress Notes (Signed)
   Patient is a 25 year old gravida 0 who presented to the office today for sonohysterogram due to the fact that patient had an ultrasound on June 29 ordered by her nurse practitioner Elon Alas because of patient's history of oligomenorrhea as well as sister and mother with PCO OS. 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 She will go sometimes 3-4 months without a menstrual cycle. She had a normal TSH and prolactin and CBC in June of this year. She is currently on prenatal vitamins and was recently prescribed metformin 500 mg daily because of PCOS like history.   Ultrasound on June 29 had demonstrated a prominent endometrium right ovary normal echo with numerous follicles increased volume 20.9 cc and left ovary increased volume thinwall cystic mass reticular echo pattern 38 x 40 x 36 mm give color flow and no fluid in the cul-de-sac.  She is here for sonohysterogram and further consultation on her primary infertility chronic anovulation picture. She was otherwise asymptomatic.  Ultrasound today uterus measures 7.0 x 3.8 x 2.6 and meters with endometrial stripe of 4.0 mm (last menstrual cycle 11/07/2015 currently on day 8 of her cycle) right and left ovary were normal previous ovarian cyst not seen no fluid in the cul-de-sac. The cervix is then cleansed with Betadine solution and a sterile catheter was introduced into the uterine cavity and no lesions were seen.  Patient was then brought to the office for consultation on her chronic anovulation picture. Since there is no ovarian cyst present and because of her history of chronic anovulation we discussed ovulation induction medication. Its risks benefits and pros and cons were discussed include hyperstimulation syndrome and multiple gestation. Her cycles do to start on August 3 if she does not start a spontaneous cycle by her and she will do a home pregnancy test. If it is negative I have given her prescription for Provera 10 mg to take 1 by  mouth daily to take for 10 days to jump start her cycle. From day 5 through 9 of her cycle she will take clomiphene citrate 100 mg and then use the ovulation predictor kit from day 12 through 16 to time or intercourse. I'm going to have her stop by the lab to check a hemoglobin A1c today. Literature information all the above was provided in written format. If she does not conceive in 3 months she'll return back to the office and we'll proceed with a semen analysis and hysterosalpingogram.  Greater than 50% time was spent counseling quantity and care for this patient time of consultation additional 15 minutes after the sonohysterogram.

## 2015-11-15 NOTE — Patient Instructions (Signed)
Hysterosalpingography Hysterosalpingography is a procedure to look inside your uterus and fallopian tubes. During this procedure, contrast dye is injected into your uterus through your vagina and cervix to illuminate your uterus while X-ray pictures are taken. This procedure may help your health care provider determine whether you have uterine tumors, adhesions, or structural abnormalities. It is commonly used to help determine why a woman is unable to have children (infertility). The procedure usually lasts about 15-30 minutes. [facility] KNOW ABOUT:  Any allergies you have.  All medicines you are taking, including vitamins, herbs, eye drops, creams, and over-the-counter medicines.  Previous problems you or members of your family have had with the use of anesthetics.  Any blood disorders you have.  Previous surgeries you have had.  Medical conditions you have. RISKS AND COMPLICATIONS  Generally, this is a safe procedure. However, as with any procedure, problems can occur. Possible problems include:  Infection in the lining of the uterus (endometritis) or fallopian tubes (salpingitis).  Damage or perforation of the uterus or fallopian tubes.  An allergic reaction to the contrast dye used to perform the X-ray. BEFORE THE PROCEDURE   Schedule the procedure after your period stops, but before your next ovulation. This is usually between day 5 and day 10 of your last period. Day 1 is the first day of your period.  Ask your health care provider about changing or stopping your regular medicines.  You may eat and drink as normal.  Empty your bladder before the procedure begins. PROCEDURE  You may be given a medicine to relax you (sedative) or an over-the-counter pain medicine to lessen any discomfort during the procedure.  You will lie down on an X-ray table with your feet in stirrups.  A device called a speculum will be placed into your vagina. This allows your  health care provider to see inside your vagina to the cervix.  The cervix will be washed with a special soap.  A thin, flexible tube will be passed through the cervix into the uterus.  Contrast dye will be put into this tube.  Several X-rays will be taken as the contrast dye spreads through the uterus and fallopian tubes.  The tube will be taken out after the procedure. AFTER THE PROCEDURE   Most of the contrast dye will flow out of your vagina naturally. You may want to wear a sanitary pad.  You may feel mild cramping and notice a little bleeding from your vagina. This should go away in 24 hours.  Ask when your test results will be ready. Make sure you get your test results.   This information is not intended to replace advice given to you by your health care provider. Make sure you discuss any questions you have with your health care provider.   Document Released: 05/26/2004 Document Revised: 04/28/2013 Document Reviewed: 10/24/2012 Elsevier Interactive Patient Education 2016 [employer] Clomiphene tablets What is this medicine? CLOMIPHENE (KLOE mi feen) is a fertility drug that increases the chance of pregnancy. It helps women ovulate (produce a mature egg) during their cycle. This medicine may be used for other purposes; ask your health care provider or pharmacist if you have questions. What should I tell my health care provider before I take this medicine? They need to know if you have any of these conditions: -adrenal gland disease -blood vessel disease or blood clots -cyst on the ovary -endometriosis -liver disease -ovarian cancer -pituitary gland disease -vaginal bleeding that has not been  evaluated -an unusual or allergic reaction to clomiphene, other medicines, foods, dyes, or preservatives -pregnant (should not be used if you are already pregnant) -breast-feeding How should I use this medicine? Take this medicine by mouth with a glass of water. Follow the  directions on the prescription label. Take exactly as directed for the exact number of days prescribed. Take your doses at regular intervals. Most women take this medicine for a 5 day period, but the length of treatment may be adjusted. Your doctor will give you a start date for this medication and will give you instructions on proper use. Do not take your medicine more often than directed. Talk to your pediatrician regarding the use of this medicine in children. Special care may be needed. Overdosage: If you think you have taken too much of this medicine contact a poison control center or emergency room at once. NOTE: This medicine is only for you. Do not share this medicine with others. What if I miss a dose? If you miss a dose, take it as soon as you can. If it is almost time for your next dose, take only that dose. Do not take double or extra doses. What may interact with this medicine? -herbal or dietary supplements, like blue cohosh, black cohosh, chasteberry, or DHEA -prasterone This list may not describe all possible interactions. Give your health care provider a list of all the medicines, herbs, non-prescription drugs, or dietary supplements you use. Also tell them if you smoke, drink alcohol, or use illegal drugs. Some items may interact with your medicine. What should I watch for while using this medicine? Make sure you understand how and when to use this medicine. You need to know when you are ovulating and when to have sexual intercourse. This will increase the chance of a pregnancy. Visit your doctor or health care professional for regular checks on your progress. You may need tests to check the hormone levels in your blood or you may have to use home-urine tests to check for ovulation. Try to keep any appointments. Compared to other fertility treatments, this medicine does not greatly increase your chances of having multiple babies. An increased chance of having twins may occur in roughly  5 out of every 100 women who take this medication. Stop taking this medicine at once and contact your doctor or health care professional if you think you are pregnant. This medicine is not for long-term use. Most women that benefit from this medicine do so within the first three cycles (months). Your doctor or health care professional will monitor your condition. This medicine is usually used for a total of 6 cycles of treatment. You may get drowsy or dizzy. Do not drive, use machinery, or do anything that needs mental alertness until you know how this drug affects you. Do not stand or sit up quickly. This reduces the risk of dizzy or fainting spells. Drinking alcoholic beverages or smoking tobacco may decrease your chance of becoming pregnant. Limit or stop alcohol and tobacco use during your fertility treatments. What side effects may I notice from receiving this medicine? Side effects that you should report to your doctor or health care professional as soon as possible: -allergic reactions like skin rash, itching or hives, swelling of the face, lips, or tongue -breathing problems -changes in vision -fluid retention -nausea, vomiting -pelvic pain or bloating -severe abdominal pain -sudden weight gain Side effects that usually do not require medical attention (report to your doctor or health care professional if  they continue or are bothersome): -breast discomfort -hot flashes -mild pelvic discomfort -mild nausea This list may not describe all possible side effects. Call your doctor for medical advice about side effects. You may report side effects to FDA at 1-800-FDA-1088. Where should I keep my medicine? Keep out of the reach of children. Store at room temperature between 15 and 30 degrees C (59 and 86 degrees F). Protect from heat, light, and moisture. Throw away any unused medicine after the expiration date. NOTE: This sheet is a summary. It may not cover all possible information. If you  have questions about this medicine, talk to your doctor, pharmacist, or health care provider.    2016, Elsevier/[employer]. (2007-08-04 22:21:06)

## 2015-11-16 LAB — HEMOGLOBIN A1C
Hgb A1c MFr Bld: 5 % (ref <5.7)
MEAN PLASMA GLUCOSE: 97 mg/dL

## 2016-01-19 ENCOUNTER — Encounter: Payer: Self-pay | Admitting: Women's Health

## 2016-01-20 ENCOUNTER — Ambulatory Visit (INDEPENDENT_AMBULATORY_CARE_PROVIDER_SITE_OTHER): Payer: Managed Care, Other (non HMO) | Admitting: Women's Health

## 2016-01-20 ENCOUNTER — Encounter: Payer: Self-pay | Admitting: Women's Health

## 2016-01-20 VITALS — BP 118/80 | Ht 67.0 in | Wt 160.0 lb

## 2016-01-20 DIAGNOSIS — N912 Amenorrhea, unspecified: Secondary | ICD-10-CM | POA: Insufficient documentation

## 2016-01-20 DIAGNOSIS — O3680X0 Pregnancy with inconclusive fetal viability, not applicable or unspecified: Secondary | ICD-10-CM

## 2016-01-20 HISTORY — DX: Amenorrhea, unspecified: N91.2

## 2016-01-20 LAB — PREGNANCY, URINE: PREG TEST UR: POSITIVE — AB

## 2016-01-20 NOTE — Patient Instructions (Signed)
First Trimester of Pregnancy The first trimester of pregnancy is from week 1 until the end of week 12 (months 1 through 3). A week after a sperm fertilizes an egg, the egg will implant on the wall of the uterus. This embryo will begin to develop into a baby. Genes from you and your partner are forming the baby. The female genes determine whether the baby is a boy or a girl. At 6-8 weeks, the eyes and face are formed, and the heartbeat can be seen on ultrasound. At the end of 12 weeks, all the baby's organs are formed.  Now that you are pregnant, you will want to do everything you can to have a healthy baby. Two of the most important things are to get good prenatal care and to follow your health care provider's instructions. Prenatal care is all the medical care you receive before the baby's birth. This care will help prevent, find, and treat any problems during the pregnancy and childbirth. BODY CHANGES Your body goes through many changes during pregnancy. The changes vary from woman to woman.   You may gain or lose a couple of pounds at first.  You may feel sick to your stomach (nauseous) and throw up (vomit). If the vomiting is uncontrollable, call your health care provider.  You may tire easily.  You may develop headaches that can be relieved by medicines approved by your health care provider.  You may urinate more often. Painful urination may mean you have a bladder infection.  You may develop heartburn as a result of your pregnancy.  You may develop constipation because certain hormones are causing the muscles that push waste through your intestines to slow down.  You may develop hemorrhoids or swollen, bulging veins (varicose veins).  Your breasts may begin to grow larger and become tender. Your nipples may stick out more, and the tissue that surrounds them (areola) may become darker.  Your gums may bleed and may be sensitive to brushing and flossing.  Dark spots or blotches (chloasma,  mask of pregnancy) may develop on your face. This will likely fade after the baby is born.  Your menstrual periods will stop.  You may have a loss of appetite.  You may develop cravings for certain kinds of food.  You may have changes in your emotions from day to day, such as being excited to be pregnant or being concerned that something may go wrong with the pregnancy and baby.  You may have more vivid and strange dreams.  You may have changes in your hair. These can include thickening of your hair, rapid growth, and changes in texture. Some women also have hair loss during or after pregnancy, or hair that feels dry or thin. Your hair will most likely return to normal after your baby is born. WHAT TO EXPECT AT YOUR PRENATAL VISITS During a routine prenatal visit:  You will be weighed to make sure you and the baby are growing normally.  Your blood pressure will be taken.  Your abdomen will be measured to track your baby's growth.  The fetal heartbeat will be listened to starting around week 10 or 12 of your pregnancy.  Test results from any previous visits will be discussed. Your health care provider may ask you:  How you are feeling.  If you are feeling the baby move.  If you have had any abnormal symptoms, such as leaking fluid, bleeding, severe headaches, or abdominal cramping.  If you are using any tobacco products,   including cigarettes, chewing tobacco, and electronic cigarettes.  If you have any questions. Other tests that may be performed during your first trimester include:  Blood tests to find your blood type and to check for the presence of any previous infections. They will also be used to check for low iron levels (anemia) and Rh antibodies. Later in the pregnancy, blood tests for diabetes will be done along with other tests if problems develop.  Urine tests to check for infections, diabetes, or protein in the urine.  An ultrasound to confirm the proper growth  and development of the baby.  An amniocentesis to check for possible genetic problems.  Fetal screens for spina bifida and Down syndrome.  You may need other tests to make sure you and the baby are doing well.  HIV (human immunodeficiency virus) testing. Routine prenatal testing includes screening for HIV, unless you choose not to have this test. HOME CARE INSTRUCTIONS  Medicines  Follow your health care provider's instructions regarding medicine use. Specific medicines may be either safe or unsafe to take during pregnancy.  Take your prenatal vitamins as directed.  If you develop constipation, try taking a stool softener if your health care provider approves. Diet  Eat regular, well-balanced meals. Choose a variety of foods, such as meat or vegetable-based protein, fish, milk and low-fat dairy products, vegetables, fruits, and whole grain breads and cereals. Your health care provider will help you determine the amount of weight gain that is right for you.  Avoid raw meat and uncooked cheese. These carry germs that can cause birth defects in the baby.  Eating four or five small meals rather than three large meals a day may help relieve nausea and vomiting. If you start to feel nauseous, eating a few soda crackers can be helpful. Drinking liquids between meals instead of during meals also seems to help nausea and vomiting.  If you develop constipation, eat more high-fiber foods, such as fresh vegetables or fruit and whole grains. Drink enough fluids to keep your urine clear or pale yellow. Activity and Exercise  Exercise only as directed by your health care provider. Exercising will help you:  Control your weight.  Stay in shape.  Be prepared for labor and delivery.  Experiencing pain or cramping in the lower abdomen or low back is a good sign that you should stop exercising. Check with your health care provider before continuing normal exercises.  Try to avoid standing for long  periods of time. Move your legs often if you must stand in one place for a long time.  Avoid heavy lifting.  Wear low-heeled shoes, and practice good posture.  You may continue to have sex unless your health care provider directs you otherwise. Relief of Pain or Discomfort  Wear a good support bra for breast tenderness.   Take warm sitz baths to soothe any pain or discomfort caused by hemorrhoids. Use hemorrhoid cream if your health care provider approves.   Rest with your legs elevated if you have leg cramps or low back pain.  If you develop varicose veins in your legs, wear support hose. Elevate your feet for 15 minutes, 3-4 times a day. Limit salt in your diet. Prenatal Care  Schedule your prenatal visits by the twelfth week of pregnancy. They are usually scheduled monthly at first, then more often in the last 2 months before delivery.  Write down your questions. Take them to your prenatal visits.  Keep all your prenatal visits as directed by your   health care provider. Safety  Wear your seat belt at all times when driving.  Make a list of emergency phone numbers, including numbers for family, friends, the hospital, and police and fire departments. General Tips  Ask your health care provider for a referral to a local prenatal education class. Begin classes no later than at the beginning of month 6 of your pregnancy.  Ask for help if you have counseling or nutritional needs during pregnancy. Your health care provider can offer advice or refer you to specialists for help with various needs.  Do not use hot tubs, steam rooms, or saunas.  Do not douche or use tampons or scented sanitary pads.  Do not cross your legs for long periods of time.  Avoid cat litter boxes and soil used by cats. These carry germs that can cause birth defects in the baby and possibly loss of the fetus by miscarriage or stillbirth.  Avoid all smoking, herbs, alcohol, and medicines not prescribed by  your health care provider. Chemicals in these affect the formation and growth of the baby.  Do not use any tobacco products, including cigarettes, chewing tobacco, and electronic cigarettes. If you need help quitting, ask your health care provider. You may receive counseling support and other resources to help you quit.  Schedule a dentist appointment. At home, brush your teeth with a soft toothbrush and be gentle when you floss. SEEK MEDICAL CARE IF:   You have dizziness.  You have mild pelvic cramps, pelvic pressure, or nagging pain in the abdominal area.  You have persistent nausea, vomiting, or diarrhea.  You have a bad smelling vaginal discharge.  You have pain with urination.  You notice increased swelling in your face, hands, legs, or ankles. SEEK IMMEDIATE MEDICAL CARE IF:   You have a fever.  You are leaking fluid from your vagina.  You have spotting or bleeding from your vagina.  You have severe abdominal cramping or pain.  You have rapid weight gain or loss.  You vomit blood or material that looks like coffee grounds.  You are exposed to German measles and have never had them.  You are exposed to fifth disease or chickenpox.  You develop a severe headache.  You have shortness of breath.  You have any kind of trauma, such as from a fall or a car accident.   This information is not intended to replace advice given to you by your health care provider. Make sure you discuss any questions you have with your health care provider.   Document Released: 04/17/2001 Document Revised: 05/14/2014 Document Reviewed: 03/03/2013 Elsevier Interactive Patient Education 2016 [employer]vier Inc.  

## 2016-01-20 NOTE — Progress Notes (Signed)
Presents today for a multiple positive home pregnancy tests.  LMP August 14th. History of PCOS, takes Metformin 500 mg BID,  completed course of Clomid last cycle for fertility. Taking daily prenatal vitamins. Denies spotting, abdominal/pelvic pain, or abnormal discharge. Rubella immune.   Exam: Appears well. Urine pregnancy positive.   Early pregnancy 4 weeks 3 days per dates  Plan: Continue daily prenatal vitamins, discontinue Metformin. Safe pregnancy behaviors reviewed. Avoid foods with nitrites such as deli meat, tuna. Avoid alcohol or smoking. Discussed continuing exercise but may notice easier fatigability. Schedule fetal viability U/S visit for first week of October  Instructed to call our office if any problems before then. Congratulations given

## 2016-02-09 ENCOUNTER — Encounter: Payer: Self-pay | Admitting: Women's Health

## 2016-02-09 ENCOUNTER — Ambulatory Visit (INDEPENDENT_AMBULATORY_CARE_PROVIDER_SITE_OTHER): Payer: Managed Care, Other (non HMO) | Admitting: Women's Health

## 2016-02-09 ENCOUNTER — Ambulatory Visit (INDEPENDENT_AMBULATORY_CARE_PROVIDER_SITE_OTHER): Payer: Managed Care, Other (non HMO)

## 2016-02-09 VITALS — BP 112/64

## 2016-02-09 DIAGNOSIS — O3680X0 Pregnancy with inconclusive fetal viability, not applicable or unspecified: Secondary | ICD-10-CM

## 2016-02-09 DIAGNOSIS — O3680X1 Pregnancy with inconclusive fetal viability, fetus 1: Secondary | ICD-10-CM

## 2016-02-09 NOTE — Progress Notes (Signed)
Presents for viability ultrasound. History of PCOS LMP 12/19/2015. Denies bleeding, abdominal pain, discharge or urinary symptoms. Having some  nausea with 2 episodes of vomiting. Extremely pleased with pregnancy.  Exam: Appears well. Ultrasound: T/V anteverted uterus with living IUP seen in fundus. FHM 138 bpm. Gestational sac seen with fetal pole CRL 7 weeks by LMP EGA 7W 3 D by dates. Right ovary corpus luteal cyst 20 x 14 mm. Left ovary normal. Negative cul-de-sac. Cervix long and closed. Normal-shaped yolk sac.  Early gestation  Plan: Copy of ultrasound report given, instructed to schedule new OB appointment, currently living in [place] , will schedule appointment there. Congratulations given. Safe pregnancy behaviors reviewed. Tips for avoiding nausea reviewed, will try B 6 and Unisom at bedtime for nausea.

## 2016-02-09 NOTE — Patient Instructions (Addendum)
Vit B6 and unisom  Take at bedtime   First Trimester of Pregnancy The first trimester of pregnancy is from week 1 until the end of week 12 (months 1 through 3). A week after a sperm fertilizes an egg, the egg will implant on the wall of the uterus. This embryo will begin to develop into a baby. Genes from you and your partner are forming the baby. The female genes determine whether the baby is a boy or a girl. At 6-8 weeks, the eyes and face are formed, and the heartbeat can be seen on ultrasound. At the end of 12 weeks, all the baby's organs are formed.  Now that you are pregnant, you will want to do everything you can to have a healthy baby. Two of the most important things are to get good prenatal care and to follow your health care provider's instructions. Prenatal care is all the medical care you receive before the baby's birth. This care will help prevent, find, and treat any problems during the pregnancy and childbirth. BODY CHANGES Your body goes through many changes during pregnancy. The changes vary from woman to woman.   You may gain or lose a couple of pounds at first.  You may feel sick to your stomach (nauseous) and throw up (vomit). If the vomiting is uncontrollable, call your health care provider.  You may tire easily.  You may develop headaches that can be relieved by medicines approved by your health care provider.  You may urinate more often. Painful urination may mean you have a bladder infection.  You may develop heartburn as a result of your pregnancy.  You may develop constipation because certain hormones are causing the muscles that push waste through your intestines to slow down.  You may develop hemorrhoids or swollen, bulging veins (varicose veins).  Your breasts may begin to grow larger and become tender. Your nipples may stick out more, and the tissue that surrounds them (areola) may become darker.  Your gums may bleed and may be sensitive to brushing and  flossing.  Dark spots or blotches (chloasma, mask of pregnancy) may develop on your face. This will likely fade after the baby is born.  Your menstrual periods will stop.  You may have a loss of appetite.  You may develop cravings for certain kinds of food.  You may have changes in your emotions from day to day, such as being excited to be pregnant or being concerned that something may go wrong with the pregnancy and baby.  You may have more vivid and strange dreams.  You may have changes in your hair. These can include thickening of your hair, rapid growth, and changes in texture. Some women also have hair loss during or after pregnancy, or hair that feels dry or thin. Your hair will most likely return to normal after your baby is born. WHAT TO EXPECT AT YOUR PRENATAL VISITS During a routine prenatal visit:  You will be weighed to make sure you and the baby are growing normally.  Your blood pressure will be taken.  Your abdomen will be measured to track your baby's growth.  The fetal heartbeat will be listened to starting around week 10 or 12 of your pregnancy.  Test results from any previous visits will be discussed. Your health care provider may ask you:  How you are feeling.  If you are feeling the baby move.  If you have had any abnormal symptoms, such as leaking fluid, bleeding, severe headaches, or  abdominal cramping.  If you are using any tobacco products, including cigarettes, chewing tobacco, and electronic cigarettes.  If you have any questions. Other tests that may be performed during your first trimester include:  Blood tests to find your blood type and to check for the presence of any previous infections. They will also be used to check for low iron levels (anemia) and Rh antibodies. Later in the pregnancy, blood tests for diabetes will be done along with other tests if problems develop.  Urine tests to check for infections, diabetes, or protein in the  urine.  An ultrasound to confirm the proper growth and development of the baby.  An amniocentesis to check for possible genetic problems.  Fetal screens for spina bifida and Down syndrome.  You may need other tests to make sure you and the baby are doing well.  HIV (human immunodeficiency virus) testing. Routine prenatal testing includes screening for HIV, unless you choose not to have this test. HOME CARE INSTRUCTIONS  Medicines  Follow your health care provider's instructions regarding medicine use. Specific medicines may be either safe or unsafe to take during pregnancy.  Take your prenatal vitamins as directed.  If you develop constipation, try taking a stool softener if your health care provider approves. Diet  Eat regular, well-balanced meals. Choose a variety of foods, such as meat or vegetable-based protein, fish, milk and low-fat dairy products, vegetables, fruits, and whole grain breads and cereals. Your health care provider will help you determine the amount of weight gain that is right for you.  Avoid raw meat and uncooked cheese. These carry germs that can cause birth defects in the baby.  Eating four or five small meals rather than three large meals a day may help relieve nausea and vomiting. If you start to feel nauseous, eating a few soda crackers can be helpful. Drinking liquids between meals instead of during meals also seems to help nausea and vomiting.  If you develop constipation, eat more high-fiber foods, such as fresh vegetables or fruit and whole grains. Drink enough fluids to keep your urine clear or pale yellow. Activity and Exercise  Exercise only as directed by your health care provider. Exercising will help you:  Control your weight.  Stay in shape.  Be prepared for labor and delivery.  Experiencing pain or cramping in the lower abdomen or low back is a good sign that you should stop exercising. Check with your health care provider before continuing  normal exercises.  Try to avoid standing for long periods of time. Move your legs often if you must stand in one place for a long time.  Avoid heavy lifting.  Wear low-heeled shoes, and practice good posture.  You may continue to have sex unless your health care provider directs you otherwise. Relief of Pain or Discomfort  Wear a good support bra for breast tenderness.   Take warm sitz baths to soothe any pain or discomfort caused by hemorrhoids. Use hemorrhoid cream if your health care provider approves.   Rest with your legs elevated if you have leg cramps or low back pain.  If you develop varicose veins in your legs, wear support hose. Elevate your feet for 15 minutes, 3-4 times a day. Limit salt in your diet. Prenatal Care  Schedule your prenatal visits by the twelfth week of pregnancy. They are usually scheduled monthly at first, then more often in the last 2 months before delivery.  Write down your questions. Take them to your prenatal visits.  Keep all your prenatal visits as directed by your health care provider. Safety  Wear your seat belt at all times when driving.  Make a list of emergency phone numbers, including numbers for family, friends, the hospital, and police and fire departments. General Tips  Ask your health care provider for a referral to a local prenatal education class. Begin classes no later than at the beginning of month 6 of your pregnancy.  Ask for help if you have counseling or nutritional needs during pregnancy. Your health care provider can offer advice or refer you to specialists for help with various needs.  Do not use hot tubs, steam rooms, or saunas.  Do not douche or use tampons or scented sanitary pads.  Do not cross your legs for long periods of time.  Avoid cat litter boxes and soil used by cats. These carry germs that can cause birth defects in the baby and possibly loss of the fetus by miscarriage or stillbirth.  Avoid all  smoking, herbs, alcohol, and medicines not prescribed by your health care provider. Chemicals in these affect the formation and growth of the baby.  Do not use any tobacco products, including cigarettes, chewing tobacco, and electronic cigarettes. If you need help quitting, ask your health care provider. You may receive counseling support and other resources to help you quit.  Schedule a dentist appointment. At home, brush your teeth with a soft toothbrush and be gentle when you floss. SEEK MEDICAL CARE IF:   You have dizziness.  You have mild pelvic cramps, pelvic pressure, or nagging pain in the abdominal area.  You have persistent nausea, vomiting, or diarrhea.  You have a bad smelling vaginal discharge.  You have pain with urination.  You notice increased swelling in your face, hands, legs, or ankles. SEEK IMMEDIATE MEDICAL CARE IF:   You have a fever.  You are leaking fluid from your vagina.  You have spotting or bleeding from your vagina.  You have severe abdominal cramping or pain.  You have rapid weight gain or loss.  You vomit blood or material that looks like coffee grounds.  You are exposed to [place] measles and have never had them.  You are exposed to fifth disease or chickenpox.  You develop a severe headache.  You have shortness of breath.  You have any kind of trauma, such as from a fall or a car accident.   This information is not intended to replace advice given to you by your health care provider. Make sure you discuss any questions you have with your health care provider.   Document Released: 04/17/2001 Document Revised: 05/14/2014 Document Reviewed: 03/03/2013 Elsevier Interactive Patient Education [employer]2016 [employer] Inc.

## 2016-09-19 ENCOUNTER — Encounter: Payer: Self-pay | Admitting: Gynecology

## 2017-03-26 ENCOUNTER — Ambulatory Visit (INDEPENDENT_AMBULATORY_CARE_PROVIDER_SITE_OTHER): Payer: Medicaid Other | Admitting: Women's Health

## 2017-03-26 ENCOUNTER — Encounter: Payer: Self-pay | Admitting: Women's Health

## 2017-03-26 VITALS — BP 118/80 | Ht 67.0 in | Wt 172.0 lb

## 2017-03-26 DIAGNOSIS — Z309 Encounter for contraceptive management, unspecified: Secondary | ICD-10-CM

## 2017-03-26 DIAGNOSIS — Z01419 Encounter for gynecological examination (general) (routine) without abnormal findings: Secondary | ICD-10-CM

## 2017-03-26 LAB — CBC WITH DIFFERENTIAL/PLATELET
BASOS PCT: 1 %
Basophils Absolute: 82 cells/uL (ref 0–200)
Eosinophils Absolute: 303 cells/uL (ref 15–500)
Eosinophils Relative: 3.7 %
HCT: 35.7 % (ref 35.0–45.0)
HEMOGLOBIN: 12.2 g/dL (ref 11.7–15.5)
LYMPHS ABS: 2583 {cells}/uL (ref 850–3900)
MCH: 29.6 pg (ref 27.0–33.0)
MCHC: 34.2 g/dL (ref 32.0–36.0)
MCV: 86.7 fL (ref 80.0–100.0)
MPV: 9.5 fL (ref 7.5–12.5)
Monocytes Relative: 8.9 %
NEUTROS ABS: 4502 {cells}/uL (ref 1500–7800)
Neutrophils Relative %: 54.9 %
PLATELETS: 351 10*3/uL (ref 140–400)
RBC: 4.12 10*6/uL (ref 3.80–5.10)
RDW: 12.9 % (ref 11.0–15.0)
TOTAL LYMPHOCYTE: 31.5 %
WBC: 8.2 10*3/uL (ref 3.8–10.8)
WBCMIX: 730 {cells}/uL (ref 200–950)

## 2017-03-26 NOTE — Patient Instructions (Signed)

## 2017-03-26 NOTE — Progress Notes (Signed)
Felicia MillerMadison D Woodard 04-Apr-1991 [id number]    History:    Presents for annual exam.  Delivered a son 09/2016 Felicia Woodard who is thriving. Prenatal care in [place] recently moved back to the area. No complications with pregnancy. She has been amenorrheic, exclusively  breast-feeding, just beginning to introduce solid foods. Using condoms. History of infertility. Normal Pap history. Gardasil completed.  Past medical history, past surgical history, family history and social history were all reviewed and documented in the EPIC chart. Works at a preschool. Sister Felicia Woodard delivered a daughter one month ago who also had infertility. Mother colon cancer age 25 survivor.  ROS:  A ROS was performed and pertinent positives and negatives are included.  Exam:  Vitals:   03/26/17 1512  BP: 118/80  Weight: 172 lb (78 kg)  Height: 5\' 7"  (1.702 m)   Body mass index is 26.94 kg/m.   General appearance:  Normal Thyroid:  Symmetrical, normal in size, without palpable masses or nodularity. Respiratory  Auscultation:  Clear without wheezing or rhonchi Cardiovascular  Auscultation:  Regular rate, without rubs, murmurs or gallops  Edema/varicosities:  Not grossly evident Abdominal  Soft,nontender, without masses, guarding or rebound.  Liver/spleen:  No organomegaly noted  Hernia:  None appreciated  Skin  Inspection:  Grossly normal   Breasts: Examined lying and sitting.     Right: Without masses, retractions, discharge or axillary adenopathy.     Left: Without masses, retractions, discharge or axillary adenopathy. Gentitourinary   Inguinal/mons:  Normal without inguinal adenopathy  External genitalia:  Normal  BUS/Urethra/Skene's glands:  Normal  Vagina:  Normal  Cervix:  Normal  Uterus:   normal in size, shape and contour.  Midline and mobile  Adnexa/parametria:     Rt: Without masses or tenderness.   Lt: Without masses or tenderness.  Anus and perineum: Normal  Digital rectal exam: Normal  sphincter tone without palpated masses or tenderness  Assessment/Plan:  26 y.o. MWF G1 P1  for annual exam with no complaints.  Delivered 09/2016 exclusively breast-feeding amenorrhea   Plan: Contraception reviewed will continue condoms, reviewed may start cycling after breast-feeding less often. SBE's, exercise, calcium rich diet, continue prenatal vitamin daily. CBC. Abnormal 2017, new screening guidelines reviewed.  Felicia Challengerancy J Woodard [facility]WHNP, 5:02 PM 03/26/2017

## 2017-11-12 ENCOUNTER — Encounter: Payer: Self-pay | Admitting: Women's Health

## 2017-12-19 ENCOUNTER — Telehealth: Payer: Self-pay | Admitting: *Deleted

## 2017-12-19 MED ORDER — METFORMIN HCL 500 MG PO TABS: 500.0000 mg | ORAL_TABLET | Freq: Every day | ORAL | 3 refills | Status: DC

## 2017-12-19 NOTE — Telephone Encounter (Signed)
Patient called requesting to start back on metformin for PCOS. States her cycles had been irregular, with skipping at times, LMP:10/16/17. Took UPT early this month and negative, not using  contraception. Will take another UPT later today to confirm.  Please advise

## 2017-12-19 NOTE — Telephone Encounter (Signed)
Ok for metformin 500mg , have her start with daily but may need to increase to twice daily to get cycles more reg

## 2017-12-19 NOTE — Telephone Encounter (Signed)
Patient informed with the below note, #30 sent to pharmacy.

## 2017-12-23 ENCOUNTER — Telehealth: Payer: Self-pay | Admitting: *Deleted

## 2017-12-23 MED ORDER — MEDROXYPROGESTERONE ACETATE 10 MG PO TABS: 10.0000 mg | ORAL_TABLET | Freq: Every day | ORAL | 0 refills | Status: DC

## 2017-12-23 NOTE — Telephone Encounter (Signed)
patient informed, Rx sent. States she will call back to schedule.

## 2017-12-23 NOTE — Telephone Encounter (Signed)
Patient called requesting progesterone to help start cycle, LMP:10/16/17, took another UPT on 12/19/17 it was negative. Please advise

## 2017-12-23 NOTE — Telephone Encounter (Signed)
Okay for the Provera 10 mg for 5 days but have her schedule appointment, tell her we do accept Medicaid.

## 2017-12-25 ENCOUNTER — Encounter: Payer: Self-pay | Admitting: Women's Health

## 2017-12-25 ENCOUNTER — Ambulatory Visit (INDEPENDENT_AMBULATORY_CARE_PROVIDER_SITE_OTHER): Payer: Self-pay | Admitting: Women's Health

## 2017-12-25 VITALS — BP 124/80

## 2017-12-25 DIAGNOSIS — E282 Polycystic ovarian syndrome: Secondary | ICD-10-CM

## 2017-12-25 NOTE — Progress Notes (Signed)
27 year old presents with amenorrhea LMP 11/02/2017, home UPT negative and started on Provera 10 mg yesterday has not started her cycle.  Is also starting on metformin 500 mg daily and will increase to twice daily in 1 month if tolerates fine.  Had been on metformin prior to birth of last son who was born 09/2016.  Used Clomid for 1 month prior to pregnancy.  History of PCOS, sister also has.  Annual due in November.  Continue with plan of care that was previously established

## 2018-03-18 ENCOUNTER — Other Ambulatory Visit: Payer: Self-pay | Admitting: Women's Health

## 2018-05-21 ENCOUNTER — Encounter: Payer: Self-pay | Admitting: Women's Health

## 2018-05-21 ENCOUNTER — Ambulatory Visit: Payer: Medicaid Other | Admitting: Women's Health

## 2018-05-21 VITALS — BP 110/78 | Ht 67.0 in | Wt 177.0 lb

## 2018-05-21 DIAGNOSIS — Z0000 Encounter for general adult medical examination without abnormal findings: Secondary | ICD-10-CM | POA: Diagnosis not present

## 2018-05-21 DIAGNOSIS — Z01419 Encounter for gynecological examination (general) (routine) without abnormal findings: Secondary | ICD-10-CM

## 2018-05-21 LAB — CBC WITH DIFFERENTIAL/PLATELET
Absolute Monocytes: 742 cells/uL (ref 200–950)
Basophils Absolute: 70 cells/uL (ref 0–200)
Basophils Relative: 0.6 %
EOS PCT: 1.1 %
Eosinophils Absolute: 128 cells/uL (ref 15–500)
HEMATOCRIT: 39.9 % (ref 35.0–45.0)
HEMOGLOBIN: 13 g/dL (ref 11.7–15.5)
LYMPHS ABS: 2134 {cells}/uL (ref 850–3900)
MCH: 28.8 pg (ref 27.0–33.0)
MCHC: 32.6 g/dL (ref 32.0–36.0)
MCV: 88.5 fL (ref 80.0–100.0)
MPV: 9.8 fL (ref 7.5–12.5)
Monocytes Relative: 6.4 %
NEUTROS ABS: 8526 {cells}/uL — AB (ref 1500–7800)
Neutrophils Relative %: 73.5 %
Platelets: 375 10*3/uL (ref 140–400)
RBC: 4.51 10*6/uL (ref 3.80–5.10)
RDW: 12.7 % (ref 11.0–15.0)
Total Lymphocyte: 18.4 %
WBC: 11.6 10*3/uL — AB (ref 3.8–10.8)

## 2018-05-21 LAB — GLUCOSE, RANDOM: Glucose, Bld: 84 mg/dL (ref 65–99)

## 2018-05-21 MED ORDER — METFORMIN HCL 500 MG PO TABS: ORAL_TABLET | ORAL | 4 refills | Status: DC

## 2018-05-21 NOTE — Patient Instructions (Signed)

## 2018-05-21 NOTE — Progress Notes (Signed)
Felicia Woodard 1990/08/23 [id number]    History:    Presents for annual exam.  30 to 35-day cycles for 5 to 6 days on Glucophage 500 mg once daily.  PCOS with amenorrhea, cycles on Glucophage.  [name] 19 months doing well and would like to conceive, actively trying for 3 months..  Normal Pap history.  Gardasil series completed.  Sister also with PCOS.  Past medical history, past surgical history, family history and social history were all reviewed and documented in the EPIC chart.  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  Mother colon cancer at age 18, recurrence at age 36 doing well now.  ROS:  A ROS was performed and pertinent positives and negatives are included.  Exam:  Vitals:   05/21/18 1400  BP: 110/78  Weight: 177 lb (80.3 kg)  Height: 5\' 7"  (1.702 m)   Body mass index is 27.72 kg/m.   General appearance:  Normal Thyroid:  Symmetrical, normal in size, without palpable masses or nodularity. Respiratory  Auscultation:  Clear without wheezing or rhonchi Cardiovascular  Auscultation:  Regular rate, without rubs, murmurs or gallops  Edema/varicosities:  Not grossly evident Abdominal  Soft,nontender, without masses, guarding or rebound.  Liver/spleen:  No organomegaly noted  Hernia:  None appreciated  Skin  Inspection:  Grossly normal   Breasts: Examined lying and sitting.     Right: Without masses, retractions, discharge or axillary adenopathy.     Left: Without masses, retractions, discharge or axillary adenopathy. Gentitourinary   Inguinal/mons:  Normal without inguinal adenopathy  External genitalia:  Normal  BUS/Urethra/Skene's glands:  Normal  Vagina:  Normal  Cervix:  Normal  Uterus:   normal in size, shape and contour.  Midline and mobile  Adnexa/parametria:     Rt: Without masses or tenderness.   Lt: Without masses or tenderness.  Anus and perineum: Normal   Assessment/Plan:  28 y.o. MWF G1 P1 for annual exam with no complaints.     30 to 35-day  cycle desiring conception PCOS  Plan: Options reviewed, will increase Glucophage 500 mg p.o. twice daily prescription, proper use given and reviewed.  Reviewed best if cycles are at least every 30 days, states has had positive ovulation with predictor kits.  Reviewed importance of intercourse frequency.  SBEs, exercise, calcium rich foods, continue prenatal vitamin daily.  CBC, glucose, Pap normal 2017, new screening guidelines reviewed.  Aware will need screening colonoscopy at 40 or if change in bowel elimination prior.    Harrington Challenger [facility], 2:30 PM 05/21/2018

## 2018-07-09 ENCOUNTER — Other Ambulatory Visit: Payer: Self-pay | Admitting: Women's Health

## 2018-08-04 ENCOUNTER — Encounter: Payer: Self-pay | Admitting: Women's Health

## 2018-08-04 ENCOUNTER — Other Ambulatory Visit: Payer: Self-pay | Admitting: *Deleted

## 2018-08-04 DIAGNOSIS — Z369 Encounter for antenatal screening, unspecified: Secondary | ICD-10-CM

## 2018-08-04 NOTE — Telephone Encounter (Signed)
Harriett Sine please see question about metformin

## 2018-08-18 ENCOUNTER — Other Ambulatory Visit: Payer: Self-pay

## 2018-08-20 ENCOUNTER — Ambulatory Visit (INDEPENDENT_AMBULATORY_CARE_PROVIDER_SITE_OTHER): Payer: Medicaid Other

## 2018-08-20 ENCOUNTER — Other Ambulatory Visit: Payer: Self-pay

## 2018-08-20 ENCOUNTER — Encounter: Payer: Self-pay | Admitting: Women's Health

## 2018-08-20 ENCOUNTER — Ambulatory Visit (INDEPENDENT_AMBULATORY_CARE_PROVIDER_SITE_OTHER): Payer: Medicaid Other | Admitting: Women's Health

## 2018-08-20 VITALS — BP 112/66

## 2018-08-20 DIAGNOSIS — O3680X1 Pregnancy with inconclusive fetal viability, fetus 1: Secondary | ICD-10-CM

## 2018-08-20 DIAGNOSIS — Z369 Encounter for antenatal screening, unspecified: Secondary | ICD-10-CM

## 2018-08-20 DIAGNOSIS — O3680X0 Pregnancy with inconclusive fetal viability, not applicable or unspecified: Secondary | ICD-10-CM

## 2018-08-20 NOTE — Progress Notes (Signed)
28 year old MWF G2 P1 presents for viability/dating ultrasound.  LMP 06/26/2018.  History of PCOS with irregular cycles.  Denies spotting, cramping, urinary symptoms, vaginal discharge, or abdominal pain.  Pleased with pregnancy.  Has 1 son who is 35 months old thriving.  Exam: Appears well.  Ultrasound: T/V anteverted uterus with viable IUP.  CRL size less than dates.  EGA [redacted] weeks 6 days, CRL 6 weeks 0 days.  Normal shape yolk sac.  Fetal heart motion seen at 49 bpm.  Right ovary normal.  Left ovary corpus luteal cyst 22 x 26 mm.  Negative cul-de-sac.  Size less than dates-13 days/early pregnancy  Plan: Ultrasound findings reviewed, reviewed size less than dates possibly related to irregular cycles/PCOS.  Repeat ultrasound in 2 weeks for viability dating.  Continue prenatal vitamin daily, continue social distancing, healthy lifestyle, and safe pregnancy behaviors reviewed.  Aware we no longer deliver, will transfer care after next ultrasound.

## 2018-08-20 NOTE — Patient Instructions (Signed)
First Trimester of Pregnancy  The first trimester of pregnancy is from week 1 until the end of week 13 (months 1 through 3). A week after a sperm fertilizes an egg, the egg will implant on the wall of the uterus. This embryo will begin to develop into a baby. Genes from you and your partner will form the baby. The female genes will determine whether the baby will be a boy or a girl. At 6-8 weeks, the eyes and face will be formed, and the heartbeat can be seen on ultrasound. At the end of 12 weeks, all the baby's organs will be formed.  Now that you are pregnant, you will want to do everything you can to have a healthy baby. Two of the most important things are to get good prenatal care and to follow your health care provider's instructions. Prenatal care is all the medical care you receive before the baby's birth. This care will help prevent, find, and treat any problems during the pregnancy and childbirth.  Body changes during your first trimester  Your body goes through many changes during pregnancy. The changes vary from woman to woman.   You may gain or lose a couple of pounds at first.   You may feel sick to your stomach (nauseous) and you may throw up (vomit). If the vomiting is uncontrollable, call your health care provider.   You may tire easily.   You may develop headaches that can be relieved by medicines. All medicines should be approved by your health care provider.   You may urinate more often. Painful urination may mean you have a bladder infection.   You may develop heartburn as a result of your pregnancy.   You may develop constipation because certain hormones are causing the muscles that push stool through your intestines to slow down.   You may develop hemorrhoids or swollen veins (varicose veins).   Your breasts may begin to grow larger and become tender. Your nipples may stick out more, and the tissue that surrounds them (areola) may become darker.   Your gums may bleed and may be  sensitive to brushing and flossing.   Dark spots or blotches (chloasma, mask of pregnancy) may develop on your face. This will likely fade after the baby is born.   Your menstrual periods will stop.   You may have a loss of appetite.   You may develop cravings for certain kinds of food.   You may have changes in your emotions from day to day, such as being excited to be pregnant or being concerned that something may go wrong with the pregnancy and baby.   You may have more vivid and strange dreams.   You may have changes in your hair. These can include thickening of your hair, rapid growth, and changes in texture. Some women also have hair loss during or after pregnancy, or hair that feels dry or thin. Your hair will most likely return to normal after your baby is born.  What to expect at prenatal visits  During a routine prenatal visit:   You will be weighed to make sure you and the baby are growing normally.   Your blood pressure will be taken.   Your abdomen will be measured to track your baby's growth.   The fetal heartbeat will be listened to between weeks 10 and 14 of your pregnancy.   Test results from any previous visits will be discussed.  Your health care provider may ask you:     How you are feeling.   If you are feeling the baby move.   If you have had any abnormal symptoms, such as leaking fluid, bleeding, severe headaches, or abdominal cramping.   If you are using any tobacco products, including cigarettes, chewing tobacco, and electronic cigarettes.   If you have any questions.  Other tests that may be performed during your first trimester include:   Blood tests to find your blood type and to check for the presence of any previous infections. The tests will also be used to check for low iron levels (anemia) and protein on red blood cells (Rh antibodies). Depending on your risk factors, or if you previously had diabetes during pregnancy, you may have tests to check for high blood sugar  that affects pregnant women (gestational diabetes).   Urine tests to check for infections, diabetes, or protein in the urine.   An ultrasound to confirm the proper growth and development of the baby.   Fetal screens for spinal cord problems (spina bifida) and Down syndrome.   HIV (human immunodeficiency virus) testing. Routine prenatal testing includes screening for HIV, unless you choose not to have this test.   You may need other tests to make sure you and the baby are doing well.  Follow these instructions at home:  Medicines   Follow your health care provider's instructions regarding medicine use. Specific medicines may be either safe or unsafe to take during pregnancy.   Take a prenatal vitamin that contains at least 600 micrograms (mcg) of folic acid.   If you develop constipation, try taking a stool softener if your health care provider approves.  Eating and drinking     Eat a balanced diet that includes fresh fruits and vegetables, whole grains, good sources of protein such as meat, eggs, or tofu, and low-fat dairy. Your health care provider will help you determine the amount of weight gain that is right for you.   Avoid raw meat and uncooked cheese. These carry germs that can cause birth defects in the baby.   Eating four or five small meals rather than three large meals a day may help relieve nausea and vomiting. If you start to feel nauseous, eating a few soda crackers can be helpful. Drinking liquids between meals, instead of during meals, also seems to help ease nausea and vomiting.   Limit foods that are high in fat and processed sugars, such as fried and sweet foods.   To prevent constipation:  ? Eat foods that are high in fiber, such as fresh fruits and vegetables, whole grains, and beans.  ? Drink enough fluid to keep your urine clear or pale yellow.  Activity   Exercise only as directed by your health care provider. Most women can continue their usual exercise routine during  pregnancy. Try to exercise for 30 minutes at least 5 days a week. Exercising will help you:  ? Control your weight.  ? Stay in shape.  ? Be prepared for labor and delivery.   Experiencing pain or cramping in the lower abdomen or lower back is a good sign that you should stop exercising. Check with your health care provider before continuing with normal exercises.   Try to avoid standing for long periods of time. Move your legs often if you must stand in one place for a long time.   Avoid heavy lifting.   Wear low-heeled shoes and practice good posture.   You may continue to have sex unless your health care   provider tells you not to.  Relieving pain and discomfort   Wear a good support bra to relieve breast tenderness.   Take warm sitz baths to soothe any pain or discomfort caused by hemorrhoids. Use hemorrhoid cream if your health care provider approves.   Rest with your legs elevated if you have leg cramps or low back pain.   If you develop varicose veins in your legs, wear support hose. Elevate your feet for 15 minutes, 3-4 times a day. Limit salt in your diet.  Prenatal care   Schedule your prenatal visits by the twelfth week of pregnancy. They are usually scheduled monthly at first, then more often in the last 2 months before delivery.   Write down your questions. Take them to your prenatal visits.   Keep all your prenatal visits as told by your health care provider. This is important.  Safety   Wear your seat belt at all times when driving.   Make a list of emergency phone numbers, including numbers for family, friends, the hospital, and police and fire departments.  General instructions   Ask your health care provider for a referral to a local prenatal education class. Begin classes no later than the beginning of month 6 of your pregnancy.   Ask for help if you have counseling or nutritional needs during pregnancy. Your health care provider can offer advice or refer you to specialists for help  with various needs.   Do not use hot tubs, steam rooms, or saunas.   Do not douche or use tampons or scented sanitary pads.   Do not cross your legs for long periods of time.   Avoid cat litter boxes and soil used by cats. These carry germs that can cause birth defects in the baby and possibly loss of the fetus by miscarriage or stillbirth.   Avoid all smoking, herbs, alcohol, and medicines not prescribed by your health care provider. Chemicals in these products affect the formation and growth of the baby.   Do not use any products that contain nicotine or tobacco, such as cigarettes and e-cigarettes. If you need help quitting, ask your health care provider. You may receive counseling support and other resources to help you quit.   Schedule a dentist appointment. At home, brush your teeth with a soft toothbrush and be gentle when you floss.  Contact a health care provider if:   You have dizziness.   You have mild pelvic cramps, pelvic pressure, or nagging pain in the abdominal area.   You have persistent nausea, vomiting, or diarrhea.   You have a bad smelling vaginal discharge.   You have pain when you urinate.   You notice increased swelling in your face, hands, legs, or ankles.   You are exposed to fifth disease or chickenpox.   You are exposed to German measles (rubella) and have never had it.  Get help right away if:   You have a fever.   You are leaking fluid from your vagina.   You have spotting or bleeding from your vagina.   You have severe abdominal cramping or pain.   You have rapid weight gain or loss.   You vomit blood or material that looks like coffee grounds.   You develop a severe headache.   You have shortness of breath.   You have any kind of trauma, such as from a fall or a car accident.  Summary   The first trimester of pregnancy is from week 1 until   the end of week 13 (months 1 through 3).   Your body goes through many changes during pregnancy. The changes vary from  woman to woman.   You will have routine prenatal visits. During those visits, your health care provider will examine you, discuss any test results you may have, and talk with you about how you are feeling.  This information is not intended to replace advice given to you by your health care provider. Make sure you discuss any questions you have with your health care provider.  Document Released: 04/17/2001 Document Revised: 04/04/2016 Document Reviewed: 04/04/2016  Elsevier Interactive Patient Education  2019 [employer]

## 2018-08-27 ENCOUNTER — Other Ambulatory Visit: Payer: Self-pay

## 2018-09-01 ENCOUNTER — Encounter: Payer: Self-pay | Admitting: Women's Health

## 2018-09-01 ENCOUNTER — Ambulatory Visit (INDEPENDENT_AMBULATORY_CARE_PROVIDER_SITE_OTHER): Payer: Medicaid Other | Admitting: Women's Health

## 2018-09-01 ENCOUNTER — Ambulatory Visit (INDEPENDENT_AMBULATORY_CARE_PROVIDER_SITE_OTHER): Payer: Medicaid Other

## 2018-09-01 ENCOUNTER — Other Ambulatory Visit: Payer: Self-pay

## 2018-09-01 ENCOUNTER — Other Ambulatory Visit: Payer: Self-pay | Admitting: Women's Health

## 2018-09-01 VITALS — BP 116/74

## 2018-09-01 DIAGNOSIS — O021 Missed abortion: Secondary | ICD-10-CM

## 2018-09-01 DIAGNOSIS — O3680X0 Pregnancy with inconclusive fetal viability, not applicable or unspecified: Secondary | ICD-10-CM

## 2018-09-01 DIAGNOSIS — O3680X1 Pregnancy with inconclusive fetal viability, fetus 1: Secondary | ICD-10-CM

## 2018-09-01 NOTE — Patient Instructions (Addendum)
Call if bleeding heavy longer than 2 days or if no bleeding in 2 weeks  Miscarriage A miscarriage is the loss of an unborn baby (fetus) before the 20th week of pregnancy. Most miscarriages happen during the first 3 months of pregnancy. Sometimes, a miscarriage can happen before a woman knows that she is pregnant. Having a miscarriage can be an emotional experience. If you have had a miscarriage, talk with your health care provider about any questions you may have about miscarrying, the grieving process, and your plans for future pregnancy. What are the causes? A miscarriage may be caused by:  Problems with the genes or chromosomes of the fetus. These problems make it impossible for the baby to develop normally. They are often the result of random errors that occur early in the development of the baby, and are not passed from parent to child (not inherited).  Infection of the cervix or uterus.  Conditions that affect hormone balance in the body.  Problems with the cervix, such as the cervix opening and thinning before pregnancy is at term (cervical insufficiency).  Problems with the uterus. These may include: ? A uterus with an abnormal shape. ? Fibroids in the uterus. ? Congenital abnormalities. These are problems that were present at birth.  Certain medical conditions.  Smoking, drinking alcohol, or using drugs.  Injury (trauma). In many cases, the cause of a miscarriage is not known. What are the signs or symptoms? Symptoms of this condition include:  Vaginal bleeding or spotting, with or without cramps or pain.  Pain or cramping in the abdomen or lower back.  Passing fluid, tissue, or blood clots from the vagina. How is this diagnosed? This condition may be diagnosed based on:  A physical exam.  Ultrasound.  Blood tests.  Urine tests. How is this treated? Treatment for a miscarriage is sometimes not necessary if you naturally pass all the tissue that was in your  uterus. If necessary, this condition may be treated with:  Dilation and curettage (D&C). This is a procedure in which the cervix is stretched open and the lining of the uterus (endometrium) is scraped. This is done only if tissue from the fetus or placenta remains in the body (incomplete miscarriage).  Medicines, such as: ? Antibiotic medicine, to treat infection. ? Medicine to help the body pass any remaining tissue. ? Medicine to reduce (contract) the size of the uterus. These medicines may be given if you have a lot of bleeding. If you have Rh negative blood and your baby was Rh positive, you will need a shot of a medicine called Rh immunoglobulinto protect your future babies from Rh blood problems. "Rh-negative" and "Rh-positive" refer to whether or not the blood has a specific protein found on the surface of red blood cells (Rh factor). Follow these instructions at home: Medicines   Take over-the-counter and prescription medicines only as told by your health care provider.  If you were prescribed antibiotic medicine, take it as told by your health care provider. Do not stop taking the antibiotic even if you start to feel better.  Do not take NSAIDs, such as aspirin and ibuprofen, unless they are approved by your health care provider. These medicines can cause bleeding. Activity  Rest as directed. Ask your health care provider what activities are safe for you.  Have someone help with home and family responsibilities during this time. General instructions  Keep track of the number of sanitary pads you use each day and how soaked (saturated)  they are. Write down this information.  Monitor the amount of tissue or blood clots that you pass from your vagina. Save any large amounts of tissue for your health care provider to examine.  Do not use tampons, douche, or have sex until your health care provider approves.  To help you and your partner with the process of grieving, talk with your  health care provider or seek counseling.  When you are ready, meet with your health care provider to discuss any important steps you should take for your health. Also, discuss steps you should take to have a healthy pregnancy in the future.  Keep all follow-up visits as told by your health care provider. This is important. Where to find more information  The American Congress of Obstetricians and Gynecologists: www.acog.org  [place] Department of Health and Cytogeneticist of Women's Health: http://hoffman.com/ Contact a health care provider if:  You have a fever or chills.  You have a foul smelling vaginal discharge.  You have more bleeding instead of less. Get help right away if:  You have severe cramps or pain in your back or abdomen.  You pass blood clots or tissue from your vagina that is walnut-sized or larger.  You soak more than 1 regular sanitary pad in an hour.  You become light-headed or weak.  You pass out.  You have feelings of sadness that take over your thoughts, or you have thoughts of hurting yourself. Summary  Most miscarriages happen in the first 3 months of pregnancy. Sometimes miscarriage happens before a woman even knows that she is pregnant.  Follow your health care provider's instruction for home care. Keep all follow-up appointments.  To help you and your partner with the process of grieving, talk with your health care provider or seek counseling. This information is not intended to replace advice given to you by your health care provider. Make sure you discuss any questions you have with your health care provider. Document Released: 10/17/2000 Document Revised: 05/29/2016 Document Reviewed: 05/29/2016 Elsevier Interactive Patient Education  2019 [employer].

## 2018-09-01 NOTE — Progress Notes (Signed)
28 year old MWF G2, P1 presents for viability/dating ultrasound.  08/20/2018 dating ultrasound size less than dates, fetal size 6 weeks 0 days with fetal heart rate of 50, per LMP 06/26/2018 dates 7 weeks 6 days.  Denies vaginal discharge, urinary symptoms, abdominal cramping, spotting.  Has had some nausea without vomiting.  A positive blood type.  History of PCOS and irregular cycles.  Ultrasound: T/V anteverted uterus with no living IUP seen in fundus.  CRL size less than dates 6 weeks.  No fetal heart motion seen.  EGA per LMP 9 weeks 4 days.  Amnion and yolk sac seen.  Right ovary normal.  Left ovary corpus luteal cyst 21 x 19 mm.  Negative cul-de-sac.  Cervix long and closed.  Missed AB A positive blood type  Plan: Ultrasound reviewed and plans made with Dr. Audie Box, options reviewed to watch, wait for spontaneous expulsion, Cytotec 400 mg vaginally, and/or D&E.  Will watch at this time and follow-up if no cramping, bleeding within the next 2 weeks.  Reviewed what to expect, condolences given.  Instructed to call if questions.

## 2018-09-02 ENCOUNTER — Encounter: Payer: Self-pay | Admitting: Women's Health

## 2018-09-02 ENCOUNTER — Telehealth: Payer: Self-pay | Admitting: Obstetrics and Gynecology

## 2018-09-02 ENCOUNTER — Other Ambulatory Visit: Payer: Self-pay | Admitting: Women's Health

## 2018-09-02 MED ORDER — MISOPROSTOL 200 MCG PO TABS: ORAL_TABLET | ORAL | 0 refills | Status: DC

## 2018-09-02 MED ORDER — IBUPROFEN 800 MG PO TABS: 800.0000 mg | ORAL_TABLET | Freq: Three times a day (TID) | ORAL | 1 refills | Status: DC | PRN

## 2018-09-02 NOTE — Progress Notes (Signed)
Telephone call to medicine, requesting medicine to induce miscarriage for missed AB.  Reviewed with Dr. Audie Box, misoprostol 600 mcg per vagina.  Reviewed if no bleeding within 1 week will repeat in 7 days.  Motrin as needed for discomfort, Motrin 800 every 8 hours as needed also called into [facility].

## 2018-09-02 NOTE — Telephone Encounter (Signed)
Phone call after hours.   Patient of Felicia Woodard and Dr. Audie Box.   Placed Cytotec vaginally today at 1:00 pm for SAB.   Passed a blood clot at 5:30 pm and the 3 Cytotec pills.  Bleeding is heavy per patient, but she has not yet changed a pad since passing the clot.  No tissue noted. She had cramping when she passed the clot.   No dizziness or lightheadedness.  She will continue to monitoring her bleeding and go to [facility] if she has heavy bleeding and symptoms of weakness, palpitations, or dizziness.   She will call the office in the am for a follow up plan.   Cc- Dr. Audie Box

## 2018-09-03 ENCOUNTER — Other Ambulatory Visit: Payer: Self-pay | Admitting: Women's Health

## 2018-09-03 DIAGNOSIS — O039 Complete or unspecified spontaneous abortion without complication: Secondary | ICD-10-CM

## 2018-09-03 NOTE — Telephone Encounter (Signed)
Telephone call to [facility], states is having light bleeding now, passed 3 clots largest one being size of golf ball with moderate amount of cramping, minimal cramping now.  Will check a quant in 1 week reviewed what to expect in the next week.  Instructed to call if heavy bleeding more than a pad per hour.  Reviewed most likely she has completed the miscarriage.  Condolences given.

## 2018-09-11 ENCOUNTER — Other Ambulatory Visit: Payer: Self-pay

## 2018-09-11 ENCOUNTER — Other Ambulatory Visit: Payer: Medicaid Other

## 2018-09-11 DIAGNOSIS — O039 Complete or unspecified spontaneous abortion without complication: Secondary | ICD-10-CM

## 2018-09-12 ENCOUNTER — Other Ambulatory Visit: Payer: Self-pay | Admitting: Women's Health

## 2018-09-12 DIAGNOSIS — O021 Missed abortion: Secondary | ICD-10-CM

## 2018-09-12 LAB — HCG, QUANTITATIVE, PREGNANCY: HCG, Total, QN: 21019 m[IU]/mL

## 2018-09-15 ENCOUNTER — Other Ambulatory Visit: Payer: Self-pay

## 2018-09-17 ENCOUNTER — Encounter (HOSPITAL_COMMUNITY): Payer: Self-pay

## 2018-09-17 ENCOUNTER — Inpatient Hospital Stay (HOSPITAL_COMMUNITY)
Admission: AD | Admit: 2018-09-17 | Discharge: 2018-09-17 | Disposition: A | Discharge status: Home / Self Care | Payer: Medicaid Other | Attending: Obstetrics & Gynecology | Admitting: Obstetrics & Gynecology

## 2018-09-17 ENCOUNTER — Other Ambulatory Visit: Payer: Self-pay

## 2018-09-17 DIAGNOSIS — Z3A12 12 weeks gestation of pregnancy: Secondary | ICD-10-CM | POA: Diagnosis not present

## 2018-09-17 DIAGNOSIS — R82998 Other abnormal findings in urine: Secondary | ICD-10-CM | POA: Diagnosis not present

## 2018-09-17 DIAGNOSIS — R509 Fever, unspecified: Secondary | ICD-10-CM

## 2018-09-17 DIAGNOSIS — Z8249 Family history of ischemic heart disease and other diseases of the circulatory system: Secondary | ICD-10-CM | POA: Diagnosis not present

## 2018-09-17 DIAGNOSIS — O26891 Other specified pregnancy related conditions, first trimester: Secondary | ICD-10-CM | POA: Diagnosis not present

## 2018-09-17 DIAGNOSIS — Z20828 Contact with and (suspected) exposure to other viral communicable diseases: Secondary | ICD-10-CM | POA: Insufficient documentation

## 2018-09-17 DIAGNOSIS — D72829 Elevated white blood cell count, unspecified: Secondary | ICD-10-CM | POA: Diagnosis not present

## 2018-09-17 DIAGNOSIS — N10 Acute tubulo-interstitial nephritis: Secondary | ICD-10-CM | POA: Diagnosis not present

## 2018-09-17 LAB — CBC WITH DIFFERENTIAL/PLATELET
Abs Immature Granulocytes: 0.05 10*3/uL (ref 0.00–0.07)
Basophils Absolute: 0.1 10*3/uL (ref 0.0–0.1)
Basophils Relative: 0 %
Eosinophils Absolute: 0 10*3/uL (ref 0.0–0.5)
Eosinophils Relative: 0 %
HCT: 33 % — ABNORMAL LOW (ref 36.0–46.0)
Hemoglobin: 10.9 g/dL — ABNORMAL LOW (ref 12.0–15.0)
Immature Granulocytes: 0 %
Lymphocytes Relative: 4 %
Lymphs Abs: 0.7 10*3/uL (ref 0.7–4.0)
MCH: 29.6 pg (ref 26.0–34.0)
MCHC: 33 g/dL (ref 30.0–36.0)
MCV: 89.7 fL (ref 80.0–100.0)
Monocytes Absolute: 0.9 10*3/uL (ref 0.1–1.0)
Monocytes Relative: 5 %
Neutro Abs: 14.4 10*3/uL — ABNORMAL HIGH (ref 1.7–7.7)
Neutrophils Relative %: 91 %
Platelets: 301 10*3/uL (ref 150–400)
RBC: 3.68 MIL/uL — ABNORMAL LOW (ref 3.87–5.11)
RDW: 12.2 % (ref 11.5–15.5)
WBC: 16 10*3/uL — ABNORMAL HIGH (ref 4.0–10.5)
nRBC: 0 % (ref 0.0–0.2)

## 2018-09-17 LAB — LACTIC ACID, PLASMA: Lactic Acid, Venous: 1.5 mmol/L (ref 0.5–1.9)

## 2018-09-17 LAB — URINALYSIS, ROUTINE W REFLEX MICROSCOPIC
Bilirubin Urine: NEGATIVE
Glucose, UA: NEGATIVE mg/dL
Ketones, ur: NEGATIVE mg/dL
Nitrite: NEGATIVE
Protein, ur: NEGATIVE mg/dL
Specific Gravity, Urine: 1.023 (ref 1.005–1.030)
pH: 7 (ref 5.0–8.0)

## 2018-09-17 LAB — ABO/RH: ABO/RH(D): A POS

## 2018-09-17 LAB — SARS CORONAVIRUS 2 BY RT PCR (HOSPITAL ORDER, PERFORMED IN ~~LOC~~ HOSPITAL LAB): SARS Coronavirus 2: NEGATIVE

## 2018-09-17 LAB — HCG, QUANTITATIVE, PREGNANCY: hCG, Beta Chain, Quant, S: 1670 m[IU]/mL — ABNORMAL HIGH (ref <5)

## 2018-09-17 MED ORDER — ACETAMINOPHEN 500 MG PO TABS
1000.0000 mg | ORAL_TABLET | Freq: Once | ORAL | Status: AC
  Administered 2018-09-17: 1000 mg via ORAL
  Filled 2018-09-17: qty 2

## 2018-09-17 MED ORDER — CEPHALEXIN 500 MG PO CAPS: 500.0000 mg | ORAL_CAPSULE | Freq: Four times a day (QID) | ORAL | 2 refills | Status: DC

## 2018-09-17 MED ORDER — CEFTRIAXONE SODIUM 250 MG IJ SOLR
250.0000 mg | Freq: Once | INTRAMUSCULAR | Status: AC
  Administered 2018-09-17: 22:00:00 250 mg via INTRAMUSCULAR
  Filled 2018-09-17: qty 250

## 2018-09-17 MED ORDER — LIDOCAINE HCL (PF) 1 % IJ SOLN
INTRAMUSCULAR | Status: AC
  Administered 2018-09-17: 22:00:00 1 mL
  Filled 2018-09-17: qty 30

## 2018-09-17 NOTE — Discharge Instructions (Signed)
Pyelonephritis, Adult Pyelonephritis is a kidney infection. The kidneys are the organs that filter a person's blood and move waste out of the bloodstream and into the urine. Urine passes from the kidneys, through the ureters, and into the bladder. There are two main types of pyelonephritis:  Infections that come on quickly without any warning (acute pyelonephritis).  Infections that last for a long period of time (chronic pyelonephritis). In most cases, the infection clears up with treatment and does not cause further problems. More severe infections or chronic infections can sometimes spread to the bloodstream or lead to other problems with the kidneys. What are the causes? This condition is usually caused by:  Bacteria traveling from the bladder to the kidney through infected urine. The urine in the bladder can become infected with bacteria from: ? Bladder infection (cystitis). ? Inflammation of the prostate gland (prostatitis). ? Sexual intercourse, in females.  Bacteria traveling from the bloodstream to the kidney. What increases the risk? This condition is more likely to develop in:  Pregnant women.  Older people.  People who have diabetes.  People who have kidney stones or bladder stones.  People who have other abnormalities of the kidney or ureter.  People who have a catheter placed in the bladder.  People who have cancer.  People who are sexually active.  Women who use spermicides.  People who have had a prior urinary tract infection. What are the signs or symptoms? Symptoms of this condition include:  Frequent urination.  Strong or persistent urge to urinate.  Burning or stinging when urinating.  Abdominal pain.  Back pain.  Pain in the side or flank area.  Fever.  Chills.  Blood in the urine, or dark urine.  Nausea.  Vomiting. How is this diagnosed? This condition may be diagnosed based on:  Medical history and physical exam.  Urine  tests.  Blood tests. You may also have imaging tests of the kidneys, such as an ultrasound or CT scan. How is this treated? Treatment for this condition may depend on the severity of the infection.  If the infection is mild and is found early, you may be treated with antibiotic medicines taken by mouth. You will need to drink fluids to remain hydrated.  If the infection is more severe, you may need to stay in the hospital and receive antibiotics given directly into a vein through an IV tube. You may also need to receive fluids through an IV tube if you are not able to remain hydrated. After your hospital stay, you may need to take oral antibiotics for a period of time. Other treatments may be required, depending on the cause of the infection. Follow these instructions at home: Medicines  Take over-the-counter and prescription medicines only as told by your health care provider.  If you were prescribed an antibiotic medicine, take it as told by your health care provider. Do not stop taking the antibiotic even if you start to feel better. General instructions  Drink enough fluid to keep your urine clear or pale yellow.  Avoid caffeine, tea, and carbonated beverages. They tend to irritate the bladder.  Urinate often. Avoid holding in urine for long periods of time.  Urinate before and after sex.  After a bowel movement, women should cleanse from front to back. Use each tissue only once.  Keep all follow-up visits as told by your health care provider. This is important. Contact a health care provider if:  Your symptoms do not get better after 2  days of treatment.  Your symptoms get worse.  You have a fever. Get help right away if:  You are unable to take your antibiotics or fluids.  You have shaking chills.  You vomit.  You have severe flank or back pain.  You have extreme weakness or fainting. This information is not intended to replace advice given to you by your health  care provider. Make sure you discuss any questions you have with your health care provider. Document Released: 04/23/2005 Document Revised: 09/29/2015 Document Reviewed: 08/16/2014 Elsevier Interactive Patient Education  2019 [employer]  Fever, Adult     A fever is an increase in the body's temperature. It is usually defined as a temperature of 100.79F (38C) or higher. Brief mild or moderate fevers generally have no long-term effects, and they often do not need treatment. Moderate or high fevers may make you feel uncomfortable and can sometimes be a sign of a serious illness or disease. The sweating that may occur with repeated or prolonged fever may also cause a loss of fluid in the body (dehydration). Fever is confirmed by taking a temperature with a thermometer. A measured temperature can vary with:  Age.  Time of day.  Where in the body you take the temperature. Readings may vary if you place the thermometer: ? In the mouth (oral). ? In the rectum (rectal). ? In the ear (tympanic). ? Under the arm (axillary). ? On the forehead (temporal). Follow these instructions at home: Medicines  Take over-the counter and prescription medicines only as told by your health care provider. Follow the dosing instructions carefully.  If you were prescribed an antibiotic medicine, take it as told by your health care provider. Do not stop taking the antibiotic even if you start to feel better. General instructions  Watch your condition for any changes. Let your health care provider know about them.  Rest as needed.  Drink enough fluid to keep your urine pale yellow. This helps to prevent dehydration.  Sponge yourself or bathe with room-temperature water to help reduce your body temperature as needed. Do not use ice water.  Do not use too many blankets or wear clothes that are too heavy.  If your fever may be caused by an infection that spreads from person to person (is contagious), such  as a cold or the flu, you should stay home from work and public gatherings for at least 24 hours after your fever is gone. Your fever should be gone without the need to use medicines. Contact a health care provider if:  You vomit.  You cannot eat or drink without vomiting.  You have diarrhea.  You have pain when you urinate.  Your symptoms do not improve with treatment.  You develop new symptoms.  You develop excessive weakness. Get help right away if:  You have shortness of breath or have trouble breathing.  You are dizzy or you faint.  You are disoriented or confused.  You develop signs of dehydration, such as: ? Dark urine, very little urine, or no urine. ? Cracked lips. ? Dry mouth. ? Sunken eyes. ? Sleepiness. ? Weakness.  You develop severe pain in your abdomen.  You have persistent vomiting or diarrhea.  You develop a skin rash.  Your symptoms suddenly get worse. Summary  A fever is an increase in the body's temperature. It is usually defined as a temperature of 100.79F (38C) or higher. Moderate or high fevers can sometimes be a sign of a serious illness or  disease. The sweating that may occur with repeated or prolonged fever may also cause dehydration.  Pay attention to any changes in your symptoms and contact your health care provider if your symptoms do not improve with treatment.  Take over-the counter and prescription medicines only as told by your health care provider. Follow the dosing instructions carefully.  If your fever is from an infection that may be contagious, such as cold or flu, you should stay home from work and public gatherings for at least 24 hours after your fever is gone. Your fever should be gone without the need to use medicines.  Get help right away if you develop signs of dehydration, such as dark urine, cracked lips, dry mouth, sunken eyes, sleepiness, or weakness. This information is not intended to replace advice given to you by  your health care provider. Make sure you discuss any questions you have with your health care provider. Document Released: 10/17/2000 Document Revised: 10/07/2017 Document Reviewed: 10/07/2017 Elsevier Interactive Patient Education  2019 [employer].

## 2018-09-17 NOTE — MAU Provider Note (Addendum)
History    Fontaine/Felicia Woodard  CSN: [id number]  Arrival date and time: 09/17/18 1941   First Provider Initiated Contact with Patient 09/17/18 1936      Chief Complaint  Patient presents with  . Fever  . Vaginal Bleeding   HPI Felicia Woodard is a 28 y.o. G2P0001 patient who presents to MAU for evaluation of new onset fever of 101 at 1700 today. Patient is s/p diagnosis of missed AB on 09/01/18. She took Cytotec 600 mcg on 09/02/18. She states she started passing clots within a few hours and saw a few of her Cytotec pills dislodge with the passage of her clots. She also reports passing tissue the following Sunday 09/06/18.  She has experienced a small amount of vaginal spotting since that time but denies heavy vaginal bleeding or passing of clots.  Patient reports new onset oral temp of 101 degrees at 1700 today. She denies dizziness, cough, shortness of breath, recent travel or proximity to ill people. She took Tylenol around 2pm today.  OB History    Gravida  2   Para  1   Term  1   Preterm      AB  0   Living  1     SAB      TAB      Ectopic  0   Multiple      Live Births  1           Past Medical History:  Diagnosis Date  . Pneumonia 2-13   TREATED AT ER    Past Surgical History:  Procedure Laterality Date  . CYSTECTOMY  2004   ON LEFT HAND  . HAND SURGERY  2007   LEFT    Family History  Problem Relation Age of Onset  . Hypertension Father   . Ovarian cancer Maternal Aunt   . Atrial fibrillation Paternal Aunt     Social History   Tobacco Use  . Smoking status: Never Smoker  . Smokeless tobacco: Never Used  Substance Use Topics  . Alcohol use: No  . Drug use: No    Allergies: No Known Allergies  Medications Prior to Admission  Medication Sig Dispense Refill Last Dose  . ibuprofen (ADVIL) 800 MG tablet Take 1 tablet (800 mg total) by mouth every 8 (eight) hours as needed. 30 tablet 1   . metFORMIN (GLUCOPHAGE) 500 MG tablet Take one  tablet by mouth twice daily with food. (Patient not taking: Reported on 08/20/2018) 180 tablet 4 Not Taking  . misoprostol (CYTOTEC) 200 MCG tablet Place 3 tablets vag x 1 time 3 tablet 0   . Prenatal Vit-Fe Fumarate-FA (PRENATAL VITAMIN PO) Take by mouth.   Taking    Review of Systems  Constitutional: Positive for fever. Negative for chills and fatigue.  Respiratory: Negative for chest tightness and shortness of breath.   Gastrointestinal: Negative for abdominal distention, abdominal pain, nausea and vomiting.  Genitourinary: Negative for difficulty urinating, dyspareunia, dysuria, flank pain, pelvic pain, vaginal bleeding, vaginal discharge and vaginal pain.  Musculoskeletal: Negative for back pain.  Skin: Negative for color change.  Neurological: Negative for dizziness, seizures, syncope, weakness and headaches.  All other systems reviewed and are negative.  Physical Exam   Blood pressure 115/68, pulse (!) 110, temperature (!) 100.5 F (38.1 C), temperature source Oral, last menstrual period 06/26/2018, SpO2 100 %, unknown if currently breastfeeding.  Physical Exam  Nursing note and vitals reviewed. Constitutional: She is oriented to person, place, and time.  She appears well-developed.  Cardiovascular: Normal rate and normal heart sounds.  Respiratory: Effort normal and breath sounds normal. No respiratory distress. She has no decreased breath sounds. She has no wheezes. She has no rales. She exhibits no tenderness.  GI: Soft. Bowel sounds are normal. She exhibits no distension. There is no abdominal tenderness. There is no rebound, no guarding and no CVA tenderness.  Genitourinary:    Vagina and uterus normal.  Cervix exhibits no motion tenderness and no friability. Right adnexum displays no tenderness. Left adnexum displays no tenderness.    Genitourinary Comments: Scant brown-tinged vaginal discharge throughout vaginal vault. No frank bleeding noted.    Neurological: She is alert  and oriented to person, place, and time.  Skin: Skin is warm and dry.  Psychiatric: She has a normal mood and affect. Her behavior is normal. Judgment and thought content normal.    MAU Course/MDM  Procedures: sterile speculum exam  Report given to Wynelle Bourgeois, CNM who assumes care of patient at this time  Clayton Bibles, CNM 09/17/18  8:18 PM    Assessment and Plan  Assumed care  Vitals:   09/17/18 1951 09/17/18 2119 09/17/18 2225 09/17/18 2240  BP: 115/68  122/70   Pulse: (!) 110  97   Resp: 18  18   Temp:  100 F (37.8 C) (!) 101.1 F (38.4 C)   TempSrc:  Oral Axillary   SpO2: 100%  99%   Weight:    82.1 kg  Height:     (1.727 m)   Results for orders placed or performed during the hospital encounter of 09/17/18 (from the past 24 hour(s))  SARS Coronavirus 2 (CEPHEID- Performed in [facility] hospital lab), Hosp Order     Status: None   Collection Time: 09/17/18  7:48 PM  Result Value Ref Range   SARS Coronavirus 2 NEGATIVE NEGATIVE  Urinalysis, Routine w reflex microscopic     Status: Abnormal   Collection Time: 09/17/18  7:58 PM  Result Value Ref Range   Color, Urine YELLOW YELLOW   APPearance HAZY (A) CLEAR   Specific Gravity, Urine 1.023 1.005 - 1.030   pH 7.0 5.0 - 8.0   Glucose, UA NEGATIVE NEGATIVE mg/dL   Hgb urine dipstick LARGE (A) NEGATIVE   Bilirubin Urine NEGATIVE NEGATIVE   Ketones, ur NEGATIVE NEGATIVE mg/dL   Protein, ur NEGATIVE NEGATIVE mg/dL   Nitrite NEGATIVE NEGATIVE   Leukocytes,Ua MODERATE (A) NEGATIVE   RBC / HPF 0-5 0 - 5 RBC/hpf   WBC, UA 6-10 0 - 5 WBC/hpf   Bacteria, UA RARE (A) NONE SEEN   Squamous Epithelial / LPF 0-5 0 - 5   Mucus PRESENT   CBC with Differential/Platelet     Status: Abnormal   Collection Time: 09/17/18  8:25 PM  Result Value Ref Range   WBC 16.0 (H) 4.0 - 10.5 K/uL   RBC 3.68 (L) 3.87 - 5.11 MIL/uL   Hemoglobin 10.9 (L) 12.0 - 15.0 g/dL   HCT 78.2 (L) 95.6 - 21.3 %   MCV 89.7 80.0 - 100.0 fL    MCH 29.6 26.0 - 34.0 pg   MCHC 33.0 30.0 - 36.0 g/dL   RDW 08.6 57.8 - 46.9 %   Platelets 301 150 - 400 K/uL   nRBC 0.0 0.0 - 0.2 %   Neutrophils Relative % 91 %   Neutro Abs 14.4 (H) 1.7 - 7.7 K/uL   Lymphocytes Relative 4 %   Lymphs Abs 0.7 0.7 -  4.0 K/uL   Monocytes Relative 5 %   Monocytes Absolute 0.9 0.1 - 1.0 K/uL   Eosinophils Relative 0 %   Eosinophils Absolute 0.0 0.0 - 0.5 K/uL   Basophils Relative 0 %   Basophils Absolute 0.1 0.0 - 0.1 K/uL   Immature Granulocytes 0 %   Abs Immature Granulocytes 0.05 0.00 - 0.07 K/uL  hCG, quantitative, pregnancy     Status: Abnormal   Collection Time: 09/17/18  8:25 PM  Result Value Ref Range   hCG, Beta Chain, Quant, S 1,670 (H) <5 mIU/mL  ABO/Rh     Status: None   Collection Time: 09/17/18  8:31 PM  Result Value Ref Range   ABO/RH(D) A POS    No rh immune globuloin      NOT A RH IMMUNE GLOBULIN CANDIDATE, PT RH POSITIVE Performed at [facility], [address] [place], [place] [id number]   Lactic acid, plasma     Status: None   Collection Time: 09/17/18  9:24 PM  Result Value Ref Range   Lactic Acid, Venous 1.5 0.5 - 1.9 mmol/L   Added Lactic Acid given fever and elevated WBC >> it was normal Reviewed findings with Dr Despina HiddenEure I think she has a UTI/Pyelonephritis, given fever, leukocytosis and UA findings  Rocephin 250mg  IM given to jumpstart therapy WIll Rx Keflex for a week to cover  Will have her Call Dr Reynold BowenFontaine's office today or tomorrow to check in before weekend Tylenol for fever  A:  Single intrauterine pregnancy at [id number]       Fever       No evidence of sepsis       Leukocytosis       Leukocytes in urine      Probable UTI/Pyelonephritis  P:  Discharge home       Rx Keflex for UTI       Encouraged to return here or to other Urgent Care/ED if she develops worsening of symptoms, increase in pain, fever, or other concerning symptoms.    Aviva SignsWilliams, Thurl Boen L, CNM

## 2018-09-17 NOTE — MAU Note (Signed)
Pt spiked a fever today, Dr. Algis Downs to come to MAU to evaluation.

## 2018-09-18 ENCOUNTER — Other Ambulatory Visit: Payer: Medicaid Other

## 2018-09-18 NOTE — Telephone Encounter (Signed)
TC to [facility], feelining better, fever down,seen at MAU for fever and UTI,  tolerating med and fluids well.  NO abd pain.  Reviewed quant is down to 1600 from 21000.  Repeat  Quant in one week, will call office to schedule. Instucted to call office if fever, uti symptoms don't resolve or abd pain.

## 2018-09-24 ENCOUNTER — Other Ambulatory Visit: Payer: Self-pay

## 2018-09-24 ENCOUNTER — Other Ambulatory Visit: Payer: Medicaid Other

## 2018-09-24 DIAGNOSIS — O021 Missed abortion: Secondary | ICD-10-CM

## 2018-09-25 ENCOUNTER — Other Ambulatory Visit: Payer: Self-pay | Admitting: Women's Health

## 2018-09-25 ENCOUNTER — Other Ambulatory Visit: Payer: Self-pay | Admitting: Obstetrics & Gynecology

## 2018-09-25 DIAGNOSIS — O021 Missed abortion: Secondary | ICD-10-CM

## 2018-09-25 LAB — HCG, QUANTITATIVE, PREGNANCY: HCG, Total, QN: 88 m[IU]/mL

## 2018-10-06 ENCOUNTER — Other Ambulatory Visit: Payer: Self-pay

## 2018-10-06 ENCOUNTER — Other Ambulatory Visit: Payer: Medicaid Other

## 2018-10-06 DIAGNOSIS — O021 Missed abortion: Secondary | ICD-10-CM

## 2018-10-07 LAB — HCG, QUANTITATIVE, PREGNANCY: HCG, Total, QN: 3 m[IU]/mL

## 2019-03-23 ENCOUNTER — Other Ambulatory Visit: Payer: Self-pay | Admitting: Women's Health

## 2019-03-23 ENCOUNTER — Encounter: Payer: Self-pay | Admitting: Women's Health

## 2019-03-23 DIAGNOSIS — O3680X0 Pregnancy with inconclusive fetal viability, not applicable or unspecified: Secondary | ICD-10-CM

## 2019-03-23 NOTE — Progress Notes (Signed)
us

## 2019-04-20 ENCOUNTER — Other Ambulatory Visit: Payer: Self-pay

## 2019-04-21 ENCOUNTER — Encounter: Payer: Self-pay | Admitting: Women's Health

## 2019-04-21 ENCOUNTER — Other Ambulatory Visit: Payer: Medicaid Other

## 2019-04-21 ENCOUNTER — Ambulatory Visit: Payer: Medicaid Other | Admitting: Women's Health

## 2019-04-21 DIAGNOSIS — N912 Amenorrhea, unspecified: Secondary | ICD-10-CM | POA: Diagnosis not present

## 2019-04-21 LAB — PREGNANCY, URINE: Preg Test, Ur: POSITIVE — AB

## 2019-04-21 NOTE — Progress Notes (Signed)
28 year old MWF G3, P1 presents with several positive home UPT's.  Having nausea with no vomiting, denies any spotting, bleeding, discharge, urinary symptoms, abdominal pain or fever.  Very happy with pregnancy.  Has a healthy lifestyle.  LMP 02/18/2019.  History of irregular cycles.  Preschool teacher.  Exam: Appears well.  Positive UPT  Early pregnancy  Plan: Congratulations given instructed to schedule viability/dating ultrasound.  Aware we no longer deliver.  Continue prenatal vitamin daily, continue safe pregnancy behaviors.  Aware to avoid alcohol, over-the-counter medications other than plain Tylenol and Robitussin, lunch meats, tuna.  Unisom and B6 at at bedtime for nausea and wrist pressure bands encouraged.

## 2019-04-21 NOTE — Patient Instructions (Signed)
unisom and b6 for nausea, try the wrist pressure bands  First Trimester of Pregnancy The first trimester of pregnancy is from week 1 until the end of week 13 (months 1 through 3). A week after a sperm fertilizes an egg, the egg will implant on the wall of the uterus. This embryo will begin to develop into a baby. Genes from you and your partner will form the baby. The female genes will determine whether the baby will be a boy or a girl. At 6-8 weeks, the eyes and face will be formed, and the heartbeat can be seen on ultrasound. At the end of 12 weeks, all the baby's organs will be formed. Now that you are pregnant, you will want to do everything you can to have a healthy baby. Two of the most important things are to get good prenatal care and to follow your health care provider's instructions. Prenatal care is all the medical care you receive before the baby's birth. This care will help prevent, find, and treat any problems during the pregnancy and childbirth. Body changes during your first trimester Your body goes through many changes during pregnancy. The changes vary from woman to woman.  You may gain or lose a couple of pounds at first.  You may feel sick to your stomach (nauseous) and you may throw up (vomit). If the vomiting is uncontrollable, call your health care provider.  You may tire easily.  You may develop headaches that can be relieved by medicines. All medicines should be approved by your health care provider.  You may urinate more often. Painful urination may mean you have a bladder infection.  You may develop heartburn as a result of your pregnancy.  You may develop constipation because certain hormones are causing the muscles that push stool through your intestines to slow down.  You may develop hemorrhoids or swollen veins (varicose veins).  Your breasts may begin to grow larger and become tender. Your nipples may stick out more, and the tissue that surrounds them (areola)  may become darker.  Your gums may bleed and may be sensitive to brushing and flossing.  Dark spots or blotches (chloasma, mask of pregnancy) may develop on your face. This will likely fade after the baby is born.  Your menstrual periods will stop.  You may have a loss of appetite.  You may develop cravings for certain kinds of food.  You may have changes in your emotions from day to day, such as being excited to be pregnant or being concerned that something may go wrong with the pregnancy and baby.  You may have more vivid and strange dreams.  You may have changes in your hair. These can include thickening of your hair, rapid growth, and changes in texture. Some women also have hair loss during or after pregnancy, or hair that feels dry or thin. Your hair will most likely return to normal after your baby is born. What to expect at prenatal visits During a routine prenatal visit:  You will be weighed to make sure you and the baby are growing normally.  Your blood pressure will be taken.  Your abdomen will be measured to track your baby's growth.  The fetal heartbeat will be listened to between weeks 10 and 14 of your pregnancy.  Test results from any previous visits will be discussed. Your health care provider may ask you:  How you are feeling.  If you are feeling the baby move.  If you have had any  abnormal symptoms, such as leaking fluid, bleeding, severe headaches, or abdominal cramping.  If you are using any tobacco products, including cigarettes, chewing tobacco, and electronic cigarettes.  If you have any questions. Other tests that may be performed during your first trimester include:  Blood tests to find your blood type and to check for the presence of any previous infections. The tests will also be used to check for low iron levels (anemia) and protein on red blood cells (Rh antibodies). Depending on your risk factors, or if you previously had diabetes during  pregnancy, you may have tests to check for high blood sugar that affects pregnant women (gestational diabetes).  Urine tests to check for infections, diabetes, or protein in the urine.  An ultrasound to confirm the proper growth and development of the baby.  Fetal screens for spinal cord problems (spina bifida) and Down syndrome.  HIV (human immunodeficiency virus) testing. Routine prenatal testing includes screening for HIV, unless you choose not to have this test.  You may need other tests to make sure you and the baby are doing well. Follow these instructions at home: Medicines  Follow your health care provider's instructions regarding medicine use. Specific medicines may be either safe or unsafe to take during pregnancy.  Take a prenatal vitamin that contains at least 600 micrograms (mcg) of folic acid.  If you develop constipation, try taking a stool softener if your health care provider approves. Eating and drinking   Eat a balanced diet that includes fresh fruits and vegetables, whole grains, good sources of protein such as meat, eggs, or tofu, and low-fat dairy. Your health care provider will help you determine the amount of weight gain that is right for you.  Avoid raw meat and uncooked cheese. These carry germs that can cause birth defects in the baby.  Eating four or five small meals rather than three large meals a day may help relieve nausea and vomiting. If you start to feel nauseous, eating a few soda crackers can be helpful. Drinking liquids between meals, instead of during meals, also seems to help ease nausea and vomiting.  Limit foods that are high in fat and processed sugars, such as fried and sweet foods.  To prevent constipation: ? Eat foods that are high in fiber, such as fresh fruits and vegetables, whole grains, and beans. ? Drink enough fluid to keep your urine clear or pale yellow. Activity  Exercise only as directed by your health care provider. Most  women can continue their usual exercise routine during pregnancy. Try to exercise for 30 minutes at least 5 days a week. Exercising will help you: ? Control your weight. ? Stay in shape. ? Be prepared for labor and delivery.  Experiencing pain or cramping in the lower abdomen or lower back is a good sign that you should stop exercising. Check with your health care provider before continuing with normal exercises.  Try to avoid standing for long periods of time. Move your legs often if you must stand in one place for a long time.  Avoid heavy lifting.  Wear low-heeled shoes and practice good posture.  You may continue to have sex unless your health care provider tells you not to. Relieving pain and discomfort  Wear a good support bra to relieve breast tenderness.  Take warm sitz baths to soothe any pain or discomfort caused by hemorrhoids. Use hemorrhoid cream if your health care provider approves.  Rest with your legs elevated if you have leg cramps  or low back pain.  If you develop varicose veins in your legs, wear support hose. Elevate your feet for 15 minutes, 3-4 times a day. Limit salt in your diet. Prenatal care  Schedule your prenatal visits by the twelfth week of pregnancy. They are usually scheduled monthly at first, then more often in the last 2 months before delivery.  Write down your questions. Take them to your prenatal visits.  Keep all your prenatal visits as told by your health care provider. This is important. Safety  Wear your seat belt at all times when driving.  Make a list of emergency phone numbers, including numbers for family, friends, the hospital, and police and fire departments. General instructions  Ask your health care provider for a referral to a local prenatal education class. Begin classes no later than the beginning of month 6 of your pregnancy.  Ask for help if you have counseling or nutritional needs during pregnancy. Your health care provider  can offer advice or refer you to specialists for help with various needs.  Do not use hot tubs, steam rooms, or saunas.  Do not douche or use tampons or scented sanitary pads.  Do not cross your legs for long periods of time.  Avoid cat litter boxes and soil used by cats. These carry germs that can cause birth defects in the baby and possibly loss of the fetus by miscarriage or stillbirth.  Avoid all smoking, herbs, alcohol, and medicines not prescribed by your health care provider. Chemicals in these products affect the formation and growth of the baby.  Do not use any products that contain nicotine or tobacco, such as cigarettes and e-cigarettes. If you need help quitting, ask your health care provider. You may receive counseling support and other resources to help you quit.  Schedule a dentist appointment. At home, brush your teeth with a soft toothbrush and be gentle when you floss. Contact a health care provider if:  You have dizziness.  You have mild pelvic cramps, pelvic pressure, or nagging pain in the abdominal area.  You have persistent nausea, vomiting, or diarrhea.  You have a bad smelling vaginal discharge.  You have pain when you urinate.  You notice increased swelling in your face, hands, legs, or ankles.  You are exposed to fifth disease or chickenpox.  You are exposed to [place] measles (rubella) and have never had it. Get help right away if:  You have a fever.  You are leaking fluid from your vagina.  You have spotting or bleeding from your vagina.  You have severe abdominal cramping or pain.  You have rapid weight gain or loss.  You vomit blood or material that looks like coffee grounds.  You develop a severe headache.  You have shortness of breath.  You have any kind of trauma, such as from a fall or a car accident. Summary  The first trimester of pregnancy is from week 1 until the end of week 13 (months 1 through 3).  Your body goes through  many changes during pregnancy. The changes vary from woman to woman.  You will have routine prenatal visits. During those visits, your health care provider will examine you, discuss any test results you may have, and talk with you about how you are feeling. This information is not intended to replace advice given to you by your health care provider. Make sure you discuss any questions you have with your health care provider. Document Released: 04/17/2001 Document Revised: 04/05/2017 Document Reviewed:  04/04/2016 Elsevier Patient Education  [place].

## 2019-05-05 ENCOUNTER — Other Ambulatory Visit: Payer: Self-pay

## 2019-05-06 ENCOUNTER — Encounter: Payer: Self-pay | Admitting: Women's Health

## 2019-05-06 ENCOUNTER — Ambulatory Visit (INDEPENDENT_AMBULATORY_CARE_PROVIDER_SITE_OTHER): Payer: Medicaid Other

## 2019-05-06 ENCOUNTER — Ambulatory Visit: Payer: Medicaid Other | Admitting: Women's Health

## 2019-05-06 DIAGNOSIS — O021 Missed abortion: Secondary | ICD-10-CM

## 2019-05-06 DIAGNOSIS — O3680X0 Pregnancy with inconclusive fetal viability, not applicable or unspecified: Secondary | ICD-10-CM

## 2019-05-06 DIAGNOSIS — Z3A01 Less than 8 weeks gestation of pregnancy: Secondary | ICD-10-CM

## 2019-05-06 NOTE — Progress Notes (Signed)
28 year old MWF G3 P1 presents for viability/dating ultrasound.  08/2018 missed AB.  Denies vaginal discharge, spotting, abdominal pain, nausea or fever.  Had some nausea initially.  LMP 02/18/2019 with several positive home UPT's in November.  Preschool teacher.  History of irregular cycles, PCOS.  Exam: Appears well, accompanied by husband. Ultrasound: Anteverted uterus, single 7-week size intrauterine gestational sac no normal yet yolk sac visible, 2 to 3 mm structure within the sac questionable collapse yolk sac versus 5+ week CRL.  No cardiac activity seen.  Cervix long and closed.  Adnexa within normal limits.  No free fluid.  Missed AB  Plan: Condolences given, will watch at this time, quantitative hCG for baseline, signs and symptoms for miscarriage reviewed, instructed to call if no bleeding in the next 1 to 2 weeks, with last missed AB Cytotec used.

## 2019-05-06 NOTE — Patient Instructions (Signed)
misca Incomplete Miscarriage A miscarriage is the loss of an unborn baby (fetus) before the 20th week of pregnancy. In an incomplete miscarriage, parts of the fetus or placenta (afterbirth) remain in the body. Most miscarriages happen in the first 3 months of pregnancy. Sometimes, it happens before a woman even knows she is pregnant. Having a miscarriage can be an emotional experience. If you have had a miscarriage, talk with your health care provider about any questions you may have about miscarrying, the grieving process, and your future pregnancy plans. What are the causes? This condition may be caused by:  Problems with the genes or chromosomes that make it impossible for the baby to develop normally. These problems are most often the result of random errors that occur early in development, and are not passed from parent to child (not inherited).  Infection of the cervix or uterus.  Conditions that affect hormone balance in the body.  Problems with the cervix, such as the cervix opening and thinning before pregnancy is at term (cervical insufficiency).  Problems with the uterus, such as a uterus with an abnormal shape, fibroids in the uterus, or problems that were present from birth (congenital abnormalities).  Certain medical conditions.  Smoking, drinking alcohol, or using drugs.  Injury (trauma). Many times, the cause of a miscarriage is not known. What are the signs or symptoms? Symptoms of this condition include:  Vaginal bleeding or spotting, with or without cramps or pain.  Pain or cramping in the abdomen or lower back.  Passing fluid, tissue, or blood clots from the vagina. How is this diagnosed? This condition may be diagnosed based on:  A physical exam.  Ultrasound.  Blood tests.  Urine tests. How is this treated? An incomplete miscarriage may be treated with:  Dilation and curettage (D&C). This is a procedure in which the cervix is stretched open and the  lining of the uterus (endometrium) is scraped to remove any remaining tissue from the pregnancy.  Medicines, such as: ? Antibiotic medicine to treat infection. ? Medicine to help any remaining tissue pass out of your uterus. ? Medicine to reduce (contract) the size of the uterus. These medicines may be given if you have a lot of bleeding. If you have Rh negative blood and your baby was Rh positive, you will need a shot of medicine called Rh immunoglobulinto protect future babies from Rh blood problems. "Rh-negative" and "Rh-positive" refer to whether or not the blood has a specific protein found on the surface of red blood cells (Rh factor). Follow these instructions at home: Medicines   Take over-the-counter and prescription medicines only as told by your health care provider.  If you were prescribed antibiotic medicine, take your antibiotic as told by your health care provider. Do not stop taking the antibiotic even if you start to feel better.  Do not take NSAIDs, such as aspirin and ibuprofen, unless approved by your doctor. These medicines can cause bleeding. Activity  Rest as directed. Ask your health care provider what activities are safe for you.  Have someone help with home and family responsibilities during this time. General instructions  Keep track of the number of sanitary pads you use each day and how soaked (saturated) they are. Write down this information.  Monitor the amount of tissue or blood clots that you pass from your vagina. Save any large amounts of tissue for your health care provider to examine.  Do not use tampons, douche, or have sex until your health care  provider approves.  To help you and your partner with the process of grieving, talk with your health care provider or seek counseling to help cope with the pregnancy loss.  When you are ready, meet with your health care provider to discuss important steps you should take for your health, as well as steps  to take in order to have a healthy pregnancy in the future.  Keep all follow-up visits as told by your health care provider. This is important. Where to find more information  The American Congress of Obstetricians and Gynecologists: www.acog.org  [place] Department of Health and Programmer, systems of Women's Health: [place] Contact a health care provider if:  You have a fever or chills.  You have a foul smelling vaginal discharge. Get help right away if:  You have severe cramps or pain in your back or abdomen.  You pass walnut-sized (or larger) blood clots or tissue from your vagina.  You have heavy bleeding, soaking more than 1 regular sanitary pad in an hour.  You become lightheaded or weak.  You pass out.  You have feelings of sadness that take over your thoughts, or you have thoughts of hurting yourself. Summary  In an incomplete miscarriage, parts of the fetus or placenta (afterbirth) remain in the body.  There are multiple treatment options for an incomplete miscarriage, talk to your health care provider about the best option for you.  Follow your health care provider's instructions for follow-up care.  To help you and your partner with the process of grieving, talk with your health care provider or seek counseling to help cope with the pregnancy loss. This information is not intended to replace advice given to you by your health care provider. Make sure you discuss any questions you have with your health care provider. Document Released: 04/23/2005 Document Revised: 05/30/2017 Document Reviewed: 05/30/2016 Elsevier Patient Education  2020 [employer].

## 2019-05-07 LAB — HCG, QUANTITATIVE, PREGNANCY: HCG, Total, QN: 27078 m[IU]/mL

## 2019-05-11 ENCOUNTER — Other Ambulatory Visit: Payer: Self-pay | Admitting: Women's Health

## 2019-05-11 DIAGNOSIS — Z8759 Personal history of other complications of pregnancy, childbirth and the puerperium: Secondary | ICD-10-CM

## 2019-05-11 MED ORDER — MISOPROSTOL 200 MCG PO TABS: ORAL_TABLET | ORAL | 0 refills | Status: DC

## 2019-05-13 ENCOUNTER — Other Ambulatory Visit: Payer: Self-pay | Admitting: Women's Health

## 2019-05-13 DIAGNOSIS — O039 Complete or unspecified spontaneous abortion without complication: Secondary | ICD-10-CM

## 2019-05-19 ENCOUNTER — Other Ambulatory Visit: Payer: Self-pay

## 2019-05-19 ENCOUNTER — Other Ambulatory Visit: Payer: Medicaid Other

## 2019-05-19 DIAGNOSIS — O039 Complete or unspecified spontaneous abortion without complication: Secondary | ICD-10-CM

## 2019-05-20 ENCOUNTER — Other Ambulatory Visit: Payer: Self-pay | Admitting: Women's Health

## 2019-05-20 DIAGNOSIS — O021 Missed abortion: Secondary | ICD-10-CM

## 2019-05-20 LAB — HCG, QUANTITATIVE, PREGNANCY: HCG, Total, QN: 2240 m[IU]/mL

## 2019-05-26 ENCOUNTER — Other Ambulatory Visit: Payer: Self-pay

## 2019-05-26 ENCOUNTER — Ambulatory Visit (INDEPENDENT_AMBULATORY_CARE_PROVIDER_SITE_OTHER): Payer: Medicaid Other | Admitting: Women's Health

## 2019-05-26 ENCOUNTER — Other Ambulatory Visit: Payer: Medicaid Other

## 2019-05-26 DIAGNOSIS — O021 Missed abortion: Secondary | ICD-10-CM

## 2019-05-26 DIAGNOSIS — Z8759 Personal history of other complications of pregnancy, childbirth and the puerperium: Secondary | ICD-10-CM

## 2019-05-26 MED ORDER — DOXYCYCLINE HYCLATE 100 MG PO TBEC: 100.0000 mg | DELAYED_RELEASE_TABLET | Freq: Two times a day (BID) | ORAL | 0 refills | Status: DC

## 2019-05-26 NOTE — Progress Notes (Signed)
29 year old MWF G3 P1 presents with irregular bleeding with cramping after Cytotec for missed AB 05/06/2019.  This is second missed AB.  States has low abdominal cramping with occasional gushing of bright red blood since inserting the Cytotec.  Denies vaginal discharge, odor or itching, urinary symptoms, or fever.  05/06/2019 hCG 27,000, 05/19/2019 hCG 2200.  Sister history of 2 SABs positive for antiphospholipid syndrome/beta-2 glycoprotein positive on Lovenox and is having a successful pregnancy.    Exam: Appears well.  External genitalia moderate amount of blood with odor, speculum exam 2 cm tissue sitting at cervical os, expelled with minimal pressure from ring forcep and a flat piece of tissue expelled shortly after, aced in pathology bottle for evaluation.  An odor was noted  Missed AB  Plan: Reviewed the cramping and bleeding should stop at this time,  patient stayed at the office for 30 minutes and walked around reports all cramping resolved and minimal spotting.  Plan made with Dr. Seymour Bars, doxycycline 100 mg twice daily for 7 days, reviewed may have a low level endometritis.  Instructed to call if any fever, abdominal pain, or heavy bleeding.  hCG and antiphospholipid syndrome testing pending.

## 2019-05-26 NOTE — Patient Instructions (Signed)
Endometritis  Endometritis is irritation, soreness, or inflammation that affects the lining of the uterus (endometrium). Infection is usually the cause of endometritis. It is important to get treatment to prevent complications. Common complications may include more severe infections and not being able to have children(infertility). What are the causes? This condition may be caused by:  Bacterial infections.  STIs (sexually transmitted infections).  A miscarriage or childbirth, especially after a long labor or cesarean delivery.  Certain gynecological procedures. These may include dilation and curettage (D&C), hysteroscopy, or birth control (contraceptive) insertion.  Tuberculosis (TB). What are the signs or symptoms? Symptoms of this condition include:  Fever.  Lower abdomen (abdominal) pain.  Pelvis (pelvic) pain.  Abnormal vaginal discharge or bleeding.  Abdominal bloating (distention) or swelling.  General discomfort or generally feeling ill.  Discomfort with bowel movements.  Constipation. How is this diagnosed? This condition may be diagnosed based on:  A physical exam, including a pelvic exam.  Tests, such as: ? Blood tests. ? Removal of a sample of endometrial tissue for testing (endometrial biopsy). ? Examining a sample of vaginal discharge under a microscope (wet prep). ? Removal of a sample of fluid from the cervix for testing (cervical culture). ? Surgical examination of the pelvis and abdomen. How is this treated? This condition is treated with:  Antibiotic medicines.  For more severe cases, hospitalization may be needed to give fluids and antibiotics directly into a vein through an IV tube. Follow these instructions at home:  Take over-the-counter and prescription medicines only as told by your health care provider.  Drink enough fluid to keep your urine clear or pale yellow.  Take your antibiotic medicine as told by your health care provider. Do  not stop taking the antibiotic even if you start to feel better.  Do not douche or have sex (including vaginal, oral, and anal sex) until your health care provider approves.  If your endometritis was caused by an STI, do not have sex (including vaginal, oral, and anal sex) until your partner has also been treated for the STI.  Return to your normal activities as told by your health care provider. Ask your health care provider what activities are safe for you.  Keep all follow-up visits as told by your health care provider. This is important. Contact a health care provider if:  You have pain that does not get better with medicine.  You have a fever.  You have pain with bowel movements. Get help right away if:  You have abdominal swelling.  You have abdominal pain that gets worse.  You have bad-smelling vaginal discharge, or an increased amount of vaginal discharge.  You have abnormal vaginal bleeding.  You have nausea and vomiting. Summary  Endometritis affects the lining of the uterus (endometrium) and is usually caused by an infection.  It is important to get treatment to prevent complications.  You have several treatment options for endometritis. Treatment may include antibiotics and IV fluids.  Take your antibiotic medicine as told by your health care provider. Do not stop taking the antibiotic even if you start to feel better.  Do not douche or have sex (including vaginal, oral, and anal sex) until your health care provider approves. This information is not intended to replace advice given to you by your health care provider. Make sure you discuss any questions you have with your health care provider. Document Revised: 04/05/2017 Document Reviewed: 05/08/2016 Elsevier Patient Education  2020 [employer]  

## 2019-05-27 ENCOUNTER — Other Ambulatory Visit: Payer: Self-pay | Admitting: Women's Health

## 2019-05-27 DIAGNOSIS — O039 Complete or unspecified spontaneous abortion without complication: Secondary | ICD-10-CM

## 2019-05-27 LAB — PATHOLOGY REPORT

## 2019-05-27 LAB — TISSUE SPECIMEN

## 2019-05-27 LAB — HCG, QUANTITATIVE, PREGNANCY: HCG, Total, QN: 883 m[IU]/mL

## 2019-05-29 ENCOUNTER — Telehealth: Payer: Self-pay

## 2019-05-29 NOTE — Telephone Encounter (Signed)
I called patient back and told her I will check with Dr. Mackey Birchwood to see what she recommends.  Bleeding is currently same as her message this morning.  Patient was advised that Dr. Seymour Bars recommended that pathology tells [place] it was a miscarriage not an ectopic or anything else which is reassuring as well, the bleeding is not hemorrhaging (flow of changing pad every hour or less), increasing or associated with fever or increased abdominal pain.  Patient is likely to have bleeding that could vary until Quant is negative.  Dr. Mackey Birchwood said she can have a quant weekly until negative.  Dr. Seymour Bars did not feel there was anything she needed a visit for today.  Patient said that made her feel so reassured and she already has lab appt for Tuesday and she will keep her quant appt then.

## 2019-05-29 NOTE — Telephone Encounter (Signed)
Patient message [place] this morning: " am still bleeding today and having some very minor cramping with it.   I bleed the most first thing in the morning and by the end of the day it slows down but then picks right back up in the mornings."  Patient said [place] called her and told her that she thought her bleeding would be stopped by today and call the office and make appt to see Dr. Mackey Birchwood today.  Bleeding is still same as in her message.

## 2019-05-30 LAB — ANTIPHOSPHOLIPID SYNDROME DIAGNOSTIC PANEL
Anticardiolipin IgA: <11 [APL'U] (ref <=11)
Anticardiolipin IgG: <14 [GPL'U] (ref <=14)
Anticardiolipin IgM: 13 [MPL'U] — ABNORMAL HIGH (ref <=12)
Beta-2 Glyco 1 IgA: <9 SAU (ref <=20)
Beta-2 Glyco 1 IgM: <9 SMU (ref <=20)
Beta-2 Glyco I IgG: <9 SGU (ref <=20)
PTT-LA Screen: 39 s (ref <=40)
dRVVT: 49 s — ABNORMAL HIGH (ref <=45)

## 2019-05-30 LAB — RFX DRVVT 1:1 MIX

## 2019-05-30 LAB — RFLX DRVVT CONFRIM: DRVVT CONFIRM: POSITIVE — AB

## 2019-06-02 ENCOUNTER — Other Ambulatory Visit: Payer: Self-pay

## 2019-06-02 ENCOUNTER — Other Ambulatory Visit: Payer: Medicaid Other

## 2019-06-02 DIAGNOSIS — O039 Complete or unspecified spontaneous abortion without complication: Secondary | ICD-10-CM

## 2019-06-02 LAB — HCG, QUANTITATIVE, PREGNANCY: HCG, Total, QN: 364 m[IU]/mL

## 2019-06-03 ENCOUNTER — Other Ambulatory Visit: Payer: Self-pay | Admitting: Women's Health

## 2019-06-03 DIAGNOSIS — O039 Complete or unspecified spontaneous abortion without complication: Secondary | ICD-10-CM

## 2019-06-06 ENCOUNTER — Other Ambulatory Visit: Payer: Self-pay | Admitting: Obstetrics & Gynecology

## 2019-06-06 MED ORDER — TINIDAZOLE 500 MG PO TABS: 1.0000 g | ORAL_TABLET | Freq: Two times a day (BID) | ORAL | 1 refills | Status: AC

## 2019-06-08 ENCOUNTER — Other Ambulatory Visit: Payer: Self-pay | Admitting: Women's Health

## 2019-06-08 DIAGNOSIS — O039 Complete or unspecified spontaneous abortion without complication: Secondary | ICD-10-CM

## 2019-06-09 ENCOUNTER — Ambulatory Visit: Payer: Medicaid Other | Admitting: Women's Health

## 2019-06-09 ENCOUNTER — Ambulatory Visit (INDEPENDENT_AMBULATORY_CARE_PROVIDER_SITE_OTHER): Payer: Medicaid Other

## 2019-06-09 ENCOUNTER — Encounter: Payer: Self-pay | Admitting: Women's Health

## 2019-06-09 ENCOUNTER — Other Ambulatory Visit (HOSPITAL_COMMUNITY)
Admission: RE | Admit: 2019-06-09 | Discharge: 2019-06-09 | Disposition: A | Discharge status: Home / Self Care | Payer: Medicaid Other | Source: Ambulatory Visit | Attending: Obstetrics and Gynecology | Admitting: Obstetrics and Gynecology

## 2019-06-09 ENCOUNTER — Other Ambulatory Visit: Payer: Medicaid Other

## 2019-06-09 ENCOUNTER — Other Ambulatory Visit: Payer: Self-pay

## 2019-06-09 ENCOUNTER — Encounter (HOSPITAL_BASED_OUTPATIENT_CLINIC_OR_DEPARTMENT_OTHER): Payer: Self-pay | Admitting: Obstetrics and Gynecology

## 2019-06-09 DIAGNOSIS — Z20822 Contact with and (suspected) exposure to covid-19: Secondary | ICD-10-CM | POA: Diagnosis not present

## 2019-06-09 DIAGNOSIS — Z3A01 Less than 8 weeks gestation of pregnancy: Secondary | ICD-10-CM | POA: Diagnosis not present

## 2019-06-09 DIAGNOSIS — O039 Complete or unspecified spontaneous abortion without complication: Secondary | ICD-10-CM

## 2019-06-09 DIAGNOSIS — O034 Incomplete spontaneous abortion without complication: Secondary | ICD-10-CM

## 2019-06-09 DIAGNOSIS — Z01812 Encounter for preprocedural laboratory examination: Secondary | ICD-10-CM | POA: Diagnosis present

## 2019-06-09 LAB — SARS CORONAVIRUS 2 (TAT 6-24 HRS): SARS Coronavirus 2: NEGATIVE

## 2019-06-09 NOTE — Patient Instructions (Signed)
Miscarriage A miscarriage is the loss of an unborn baby (fetus) before the 20th week of pregnancy. Most miscarriages happen during the first 3 months of pregnancy. Sometimes, a miscarriage can happen before a woman knows that she is pregnant. Having a miscarriage can be an emotional experience. If you have had a miscarriage, talk with your health care provider about any questions you may have about miscarrying, the grieving process, and your plans for future pregnancy. What are the causes? A miscarriage may be caused by:  Problems with the genes or chromosomes of the fetus. These problems make it impossible for the baby to develop normally. They are often the result of random errors that occur early in the development of the baby, and are not passed from parent to child (not inherited).  Infection of the cervix or uterus.  Conditions that affect hormone balance in the body.  Problems with the cervix, such as the cervix opening and thinning before pregnancy is at term (cervical insufficiency).  Problems with the uterus. These may include: ? A uterus with an abnormal shape. ? Fibroids in the uterus. ? Congenital abnormalities. These are problems that were present at birth.  Certain medical conditions.  Smoking, drinking alcohol, or using drugs.  Injury (trauma). In many cases, the cause of a miscarriage is not known. What are the signs or symptoms? Symptoms of this condition include:  Vaginal bleeding or spotting, with or without cramps or pain.  Pain or cramping in the abdomen or lower back.  Passing fluid, tissue, or blood clots from the vagina. How is this diagnosed? This condition may be diagnosed based on:  A physical exam.  Ultrasound.  Blood tests.  Urine tests. How is this treated? Treatment for a miscarriage is sometimes not necessary if you naturally pass all the tissue that was in your uterus. If necessary, this condition may be treated with:  Dilation and  curettage (D&C). This is a procedure in which the cervix is stretched open and the lining of the uterus (endometrium) is scraped. This is done only if tissue from the fetus or placenta remains in the body (incomplete miscarriage).  Medicines, such as: ? Antibiotic medicine, to treat infection. ? Medicine to help the body pass any remaining tissue. ? Medicine to reduce (contract) the size of the uterus. These medicines may be given if you have a lot of bleeding. If you have Rh negative blood and your baby was Rh positive, you will need a shot of a medicine called Rh immunoglobulinto protect your future babies from Rh blood problems. "Rh-negative" and "Rh-positive" refer to whether or not the blood has a specific protein found on the surface of red blood cells (Rh factor). Follow these instructions at home: Medicines   Take over-the-counter and prescription medicines only as told by your health care provider.  If you were prescribed antibiotic medicine, take it as told by your health care provider. Do not stop taking the antibiotic even if you start to feel better.  Do not take NSAIDs, such as aspirin and ibuprofen, unless they are approved by your health care provider. These medicines can cause bleeding. Activity  Rest as directed. Ask your health care provider what activities are safe for you.  Have someone help with home and family responsibilities during this time. General instructions  Keep track of the number of sanitary pads you use each day and how soaked (saturated) they are. Write down this information.  Monitor the amount of tissue or blood clots that   you pass from your vagina. Save any large amounts of tissue for your health care provider to examine.  Do not use tampons, douche, or have sex until your health care provider approves.  To help you and your partner with the process of grieving, talk with your health care provider or seek counseling.  When you are ready, meet with  your health care provider to discuss any important steps you should take for your health. Also, discuss steps you should take to have a healthy pregnancy in the future.  Keep all follow-up visits as told by your health care provider. This is important. Where to find more information  The American Congress of Obstetricians and Gynecologists: www.acog.org  [place] Department of Health and Human Services Office of Women's Health: www.womenshealth.gov Contact a health care provider if:  You have a fever or chills.  You have a foul smelling vaginal discharge.  You have more bleeding instead of less. Get help right away if:  You have severe cramps or pain in your back or abdomen.  You pass blood clots or tissue from your vagina that is walnut-sized or larger.  You soak more than 1 regular sanitary pad in an hour.  You become light-headed or weak.  You pass out.  You have feelings of sadness that take over your thoughts, or you have thoughts of hurting yourself. Summary  Most miscarriages happen in the first 3 months of pregnancy. Sometimes miscarriage happens before a woman even knows that she is pregnant.  Follow your health care provider's instruction for home care. Keep all follow-up appointments.  To help you and your partner with the process of grieving, talk with your health care provider or seek counseling. This information is not intended to replace advice given to you by your health care provider. Make sure you discuss any questions you have with your health care provider. Document Revised: 08/15/2018 Document Reviewed: 05/29/2016 Elsevier Patient Education  2020 Elsevier Inc.   Managing Pregnancy Loss Pregnancy loss can happen any time during a pregnancy. Often the cause is not known. It is rarely because of anything you did. Pregnancy loss in early pregnancy (during the first trimester) is called a miscarriage. This type of pregnancy loss is the most common. Pregnancy loss  that happens after 20 weeks of pregnancy is called fetal demise if the baby's heart stops beating before birth. Fetal demise is much less common. Some women experience spontaneous labor shortly after fetal demise resulting in a stillborn birth (stillbirth). Any pregnancy loss can be devastating. You will need to recover both physically and emotionally. Most women are able to get pregnant again after a pregnancy loss and deliver a healthy baby. How to manage emotional recovery  Pregnancy loss is very hard emotionally. You may feel many different emotions while you grieve. You may feel sad and angry. You may also feel guilty. It is normal to have periods of crying. Emotional recovery can take longer than physical recovery. It is different for everyone. Taking these steps can help you in managing this loss:  Remember that it is unlikely you did anything to cause the pregnancy loss.  Share your thoughts and feelings with friends, family, and your partner. Remember that your partner is also recovering emotionally.  Make sure you have a good support system. Do not spend too much time alone.  Meet with a pregnancy loss counselor or join a pregnancy loss support group.  Get enough sleep and eat a healthy diet. Return to regular exercise   when you have recovered physically.  Do not use drugs or alcohol to manage your emotions.  Consider seeing a mental health professional to help you recover emotionally.  Ask a friend or loved one to help you decide what to do with any clothing and nursery items you received for your baby. In the case of a stillbirth, many women benefit from taking additional steps in the grieving process. You may want to:  Hold your baby after the birth.  Name your baby.  Request a birth certificate.  Create a keepsake such as handprints or footprints.  Dress your baby and have a picture taken.  Make funeral arrangements.  Ask for a baptism or blessing. Hospitals have  staff members who can help you with all these arrangements. How to recognize emotional stress It is normal to have emotional stress after a pregnancy loss. But emotional stress that lasts a long time or becomes severe requires treatment. Watch out for these signs of severe emotional stress:  Sadness, anger, or guilt that is not going away and is interfering with your normal activities.  Relationship problems that have occurred or gotten worse since the pregnancy loss.  Signs of depression that last longer than 2 weeks. These may include: ? Sadness. ? Anxiety. ? Hopelessness. ? Loss of interest in activities you enjoy. ? Inability to concentrate. ? Trouble sleeping or sleeping too much. ? Loss of appetite or overeating. ? Thoughts of death or of hurting yourself. Follow these instructions at home:  Take over-the-counter and prescription medicines only as told by your health care provider.  Rest at home until your energy level returns. Return to your normal activities as told by your health care provider. Ask your health care provider what activities are safe for you.  When you are ready, meet with your health care provider to discuss steps to take for a future pregnancy.  Keep all follow-up visits as told by your health care provider. This is important. Where to find support  To help you and your partner with the process of grieving, talk with your health care provider or seek counseling.  Consider meeting with others who have experienced pregnancy loss. Ask your health care provider about support groups and resources. Where to find more information  [place] Department of Health and Human Services Office on Women's Health: www.womenshealth.gov  American Pregnancy Association: www.americanpregnancy.org Contact a health care provider if:  You continue to experience grief, sadness, or lack of motivation for everyday activities, and those feelings do not improve over time.  You are  struggling to recover emotionally, especially if you are using alcohol or substances to help. Get help right away if:  You have thoughts of hurting yourself or others. If you ever feel like you may hurt yourself or others, or have thoughts about taking your own life, get help right away. You can go to your nearest emergency department or call:  Your local emergency services (911 in the [place]  A suicide crisis helpline, such as the National Suicide Prevention Lifeline at 1-800-273-8255. This is open 24 hours a day. Summary  Any pregnancy loss can be difficult physically and emotionally.  You may experience many different emotions while you grieve. Emotional recovery can last longer than physical recovery.  It is normal to have emotional stress after a pregnancy loss. But emotional stress that lasts a long time or becomes severe requires treatment.  See your health care provider if you are struggling emotionally after a pregnancy loss. This information   is not intended to replace advice given to you by your health care provider. Make sure you discuss any questions you have with your health care provider. Document Revised: 08/13/2018 Document Reviewed: 07/04/2017 Elsevier Patient Education  2020 Elsevier Inc.  

## 2019-06-09 NOTE — Progress Notes (Signed)
Spoke w/ via phone for pre-op interview--- PT Lab needs dos----   Per anesthesia , No            Lab results------ no COVID test ------ 06-09-2019 Arrive at ------- 0530 NPO after ------ [place] Medications to take morning of surgery ----- NONE Diabetic medication ----- n/a Patient Special Instructions ----- asked pt to bring rescue inhaler dos Pre-Op special Istructions ----- n/a Patient verbalized understanding of instructions that were given at this phone interview. Patient denies shortness of breath, chest pain, fever, cough a this phone interview.

## 2019-06-09 NOTE — Progress Notes (Signed)
29 yo MWF G3P1 presents for [place] due to spotting and cramping for past month.  05/06/2019 viability ultrasound appointment a single 7-week size intrauterine gestational sac with no yolk sac visible and no cardiac activity seen.  Missed AB April 2020 past with Cytotec.  A+ blood type.  05/06/2019 quant 19,622, 05/19/2019 quant 2240, 05/26/2019 quant 883.  At office visit on 05/26/2019 passed a large clot pathology-chorionic villi.  Has had heavy bleeding after Cytotec, persistent spotting and cramping past month despite falling quantitative hCGs.   History of PCOS.  Sister positive for anticardiolipin currently on Lovenox with current pregnancy, history of 2 SABs.  05/26/2019 anticardiolipin IgG normal, anticardiolipin IgM slightly elevated at 13 (less than 12 normal).  Exam: Appears well, states still having some cramping with spotting, was treated for BV 2 days ago which has resolved odor. Ultrasound: Anteverted uterus, normal size and shape with no myometrial masses.  Large amount of fluid, debris remains in lower uterine segment, canal dilated to 2.1 cm, large vascular component still present area consistent with incomplete AB.  External os appears closed.  Both ovaries normal size with normal follicular pattern.  No adnexal masses.  No free fluid.    Incomplete AB  Plan: Condolences given, reviewed miscarriage is not complete and need for intervention at this time.  Reviewed D&E, Dr. Penni Bombard in, reviewed risks of infection, perforation and hemorrhage, anesthesia risks.  Reviewed remote risk of Asherman's, patient accepts and would like to proceed.  Questions answered.  Pending negative Covid testing today, scheduled for 06/12/2019.

## 2019-06-09 NOTE — Progress Notes (Addendum)
Preoperative Note  I was given a briefing regarding the patient's retained products of conception/incomplete spontaneous abortion by Maryelizabeth Rowan.  We reviewed the details of her clinical course thus far and recent ultrasound imaging in addition to recent pathology report indicating chorionic villi suggesting retained products of conception.  The patient has had ongoing bleeding and cramping despite courses of Cytotec.  Condolences are offered for the recent pregnancy loss.  Recommendation to proceed with dilation and curettage was discussed with the patient.  Surgical risks including infection, bleeding, need for blood transfusion, hemorrhage, uterine injury/perforation, possible injury to surrounding organ structures including bowel, bladder, blood vessels of the pelvis, and other organ structures in addition to anesthesia risks and remote risk of Asherman syndrome and difficulty with conceiving or carrying pregnancy in the future are all reviewed.  Postoperative recovery expectations are also reviewed.  The patient is in agreement and would like to proceed.  All questions were answered.  We will have her surgery schedulers coordinate.  [name], MD 06/09/19

## 2019-06-10 LAB — HCG, QUANTITATIVE, PREGNANCY: HCG, Total, QN: 86 m[IU]/mL

## 2019-06-11 NOTE — Anesthesia Preprocedure Evaluation (Addendum)
Anesthesia Evaluation  Patient identified by MRN, date of birth, ID band Patient awake    Reviewed: Allergy & Precautions, NPO status , Patient's Chart, lab work & pertinent test results  Airway Mallampati: II  TM Distance: >3 FB Neck ROM: Full    Dental no notable dental hx. (+) Dental Advisory Given, Teeth Intact   Pulmonary asthma ,    Pulmonary exam normal breath sounds clear to auscultation       Cardiovascular negative cardio ROS Normal cardiovascular exam Rhythm:Regular Rate:Normal     Neuro/Psych negative neurological ROS  negative psych ROS   GI/Hepatic negative GI ROS, Neg liver ROS,   Endo/Other  negative endocrine ROS  Renal/GU negative Renal ROS     Musculoskeletal negative musculoskeletal ROS (+)   Abdominal   Peds  Hematology negative hematology ROS (+)   Anesthesia Other Findings   Reproductive/Obstetrics negative OB ROS                            Anesthesia Physical Anesthesia Plan  ASA: II  Anesthesia Plan: General   Post-op Pain Management:    Induction: Intravenous  PONV Risk Score and Plan: 4 or greater and Ondansetron, Dexamethasone, Midazolam, Scopolamine patch - Pre-op and Treatment may vary due to age or medical condition  Airway Management Planned: LMA  Additional Equipment: None  Intra-op Plan:   Post-operative Plan: Extubation in OR  Informed Consent: I have reviewed the patients History and Physical, chart, labs and discussed the procedure including the risks, benefits and alternatives for the proposed anesthesia with the patient or authorized representative who has indicated his/her understanding and acceptance.     Dental advisory given  Plan Discussed with: CRNA  Anesthesia Plan Comments:         Anesthesia Quick Evaluation

## 2019-06-12 ENCOUNTER — Encounter (HOSPITAL_BASED_OUTPATIENT_CLINIC_OR_DEPARTMENT_OTHER): Payer: Self-pay | Admitting: Obstetrics and Gynecology

## 2019-06-12 ENCOUNTER — Other Ambulatory Visit: Payer: Self-pay

## 2019-06-12 ENCOUNTER — Ambulatory Visit (HOSPITAL_BASED_OUTPATIENT_CLINIC_OR_DEPARTMENT_OTHER): Payer: Medicaid Other | Admitting: Anesthesiology

## 2019-06-12 ENCOUNTER — Encounter (HOSPITAL_BASED_OUTPATIENT_CLINIC_OR_DEPARTMENT_OTHER)
Admission: RE | Disposition: A | Discharge status: Home / Self Care | Payer: Self-pay | Source: Home / Self Care | Attending: Obstetrics and Gynecology

## 2019-06-12 ENCOUNTER — Ambulatory Visit (HOSPITAL_BASED_OUTPATIENT_CLINIC_OR_DEPARTMENT_OTHER)
Admission: RE | Admit: 2019-06-12 | Discharge: 2019-06-12 | Disposition: A | Discharge status: Home / Self Care | Payer: Medicaid Other | Attending: Obstetrics and Gynecology | Admitting: Obstetrics and Gynecology

## 2019-06-12 DIAGNOSIS — O99519 Diseases of the respiratory system complicating pregnancy, unspecified trimester: Secondary | ICD-10-CM | POA: Diagnosis not present

## 2019-06-12 DIAGNOSIS — J45909 Unspecified asthma, uncomplicated: Secondary | ICD-10-CM | POA: Diagnosis not present

## 2019-06-12 DIAGNOSIS — O021 Missed abortion: Secondary | ICD-10-CM

## 2019-06-12 DIAGNOSIS — O034 Incomplete spontaneous abortion without complication: Secondary | ICD-10-CM | POA: Diagnosis present

## 2019-06-12 HISTORY — PX: DILATION AND EVACUATION: SHX1459

## 2019-06-12 HISTORY — DX: Polycystic ovarian syndrome: E28.2

## 2019-06-12 HISTORY — DX: Unspecified asthma, uncomplicated: J45.909

## 2019-06-12 HISTORY — DX: Personal history of other complications of pregnancy, childbirth and the puerperium: Z87.59

## 2019-06-12 LAB — CBC
HCT: 35.8 % — ABNORMAL LOW (ref 36.0–46.0)
Hemoglobin: 11.5 g/dL — ABNORMAL LOW (ref 12.0–15.0)
MCH: 28.5 pg (ref 26.0–34.0)
MCHC: 32.1 g/dL (ref 30.0–36.0)
MCV: 88.6 fL (ref 80.0–100.0)
Platelets: 461 10*3/uL — ABNORMAL HIGH (ref 150–400)
RBC: 4.04 MIL/uL (ref 3.87–5.11)
RDW: 12.6 % (ref 11.5–15.5)
WBC: 8.7 10*3/uL (ref 4.0–10.5)
nRBC: 0 % (ref 0.0–0.2)

## 2019-06-12 LAB — TYPE AND SCREEN
ABO/RH(D): A POS
Antibody Screen: NEGATIVE

## 2019-06-12 LAB — ABO/RH: ABO/RH(D): A POS

## 2019-06-12 SURGERY — DILATION AND EVACUATION, UTERUS
Anesthesia: General | Site: Vagina

## 2019-06-12 MED ORDER — PROPOFOL 10 MG/ML IV BOLUS
INTRAVENOUS | Status: DC | PRN
  Administered 2019-06-12: 200 mg via INTRAVENOUS

## 2019-06-12 MED ORDER — MIDAZOLAM HCL 2 MG/2ML IJ SOLN
INTRAMUSCULAR | Status: DC | PRN
  Administered 2019-06-12: 2 mg via INTRAVENOUS

## 2019-06-12 MED ORDER — DOXYCYCLINE HYCLATE 100 MG PO TABS
ORAL_TABLET | ORAL | Status: AC
  Filled 2019-06-12: qty 2

## 2019-06-12 MED ORDER — KETOROLAC TROMETHAMINE 30 MG/ML IJ SOLN
INTRAMUSCULAR | Status: AC
  Filled 2019-06-12: qty 1

## 2019-06-12 MED ORDER — LIDOCAINE 2% (20 MG/ML) 5 ML SYRINGE
INTRAMUSCULAR | Status: AC
  Filled 2019-06-12: qty 5

## 2019-06-12 MED ORDER — ONDANSETRON HCL 4 MG/2ML IJ SOLN
INTRAMUSCULAR | Status: DC | PRN
  Administered 2019-06-12: 4 mg via INTRAVENOUS

## 2019-06-12 MED ORDER — PROMETHAZINE HCL 25 MG/ML IJ SOLN
6.2500 mg | INTRAMUSCULAR | Status: DC | PRN
  Filled 2019-06-12: qty 1

## 2019-06-12 MED ORDER — PROPOFOL 10 MG/ML IV BOLUS
INTRAVENOUS | Status: AC
  Filled 2019-06-12: qty 40

## 2019-06-12 MED ORDER — HYDROMORPHONE HCL 1 MG/ML IJ SOLN
0.2500 mg | INTRAMUSCULAR | Status: DC | PRN
  Filled 2019-06-12: qty 0.5

## 2019-06-12 MED ORDER — LACTATED RINGERS IV SOLN
INTRAVENOUS | Status: DC
  Administered 2019-06-12: 1000 mL via INTRAVENOUS
  Filled 2019-06-12: qty 1000

## 2019-06-12 MED ORDER — LACTATED RINGERS IV SOLN
INTRAVENOUS | Status: DC
  Filled 2019-06-12: qty 1000

## 2019-06-12 MED ORDER — FENTANYL CITRATE (PF) 100 MCG/2ML IJ SOLN
INTRAMUSCULAR | Status: AC
  Filled 2019-06-12: qty 2

## 2019-06-12 MED ORDER — KETOROLAC TROMETHAMINE 30 MG/ML IJ SOLN
INTRAMUSCULAR | Status: DC | PRN
  Administered 2019-06-12: 30 mg via INTRAVENOUS

## 2019-06-12 MED ORDER — OXYCODONE HCL 5 MG PO TABS
5.0000 mg | ORAL_TABLET | Freq: Once | ORAL | Status: DC | PRN
  Filled 2019-06-12: qty 1

## 2019-06-12 MED ORDER — FENTANYL CITRATE (PF) 100 MCG/2ML IJ SOLN
INTRAMUSCULAR | Status: DC | PRN
  Administered 2019-06-12: 50 ug via INTRAVENOUS
  Administered 2019-06-12 (×2): 25 ug via INTRAVENOUS

## 2019-06-12 MED ORDER — DEXAMETHASONE SODIUM PHOSPHATE 10 MG/ML IJ SOLN
INTRAMUSCULAR | Status: AC
  Filled 2019-06-12: qty 1

## 2019-06-12 MED ORDER — DOXYCYCLINE HYCLATE 100 MG PO TABS
200.0000 mg | ORAL_TABLET | Freq: Once | ORAL | Status: AC
  Administered 2019-06-12: 200 mg via ORAL
  Filled 2019-06-12: qty 2

## 2019-06-12 MED ORDER — KETOROLAC TROMETHAMINE 30 MG/ML IJ SOLN
30.0000 mg | Freq: Once | INTRAMUSCULAR | Status: DC | PRN
  Filled 2019-06-12: qty 1

## 2019-06-12 MED ORDER — OXYCODONE HCL 5 MG/5ML PO SOLN
5.0000 mg | Freq: Once | ORAL | Status: DC | PRN
  Filled 2019-06-12: qty 5

## 2019-06-12 MED ORDER — DEXAMETHASONE SODIUM PHOSPHATE 10 MG/ML IJ SOLN
INTRAMUSCULAR | Status: DC | PRN
  Administered 2019-06-12 (×2): 5 mg via INTRAVENOUS

## 2019-06-12 MED ORDER — MIDAZOLAM HCL 2 MG/2ML IJ SOLN
INTRAMUSCULAR | Status: AC
  Filled 2019-06-12: qty 2

## 2019-06-12 MED ORDER — BUPIVACAINE HCL (PF) 0.25 % IJ SOLN
INTRAMUSCULAR | Status: DC | PRN
  Administered 2019-06-12: 10 mL

## 2019-06-12 MED ORDER — MEPERIDINE HCL 25 MG/ML IJ SOLN
6.2500 mg | INTRAMUSCULAR | Status: DC | PRN
  Filled 2019-06-12: qty 1

## 2019-06-12 MED ORDER — SILVER NITRATE-POT NITRATE 75-25 % EX MISC
CUTANEOUS | Status: DC | PRN
  Administered 2019-06-12: 2

## 2019-06-12 MED ORDER — LIDOCAINE 2% (20 MG/ML) 5 ML SYRINGE
INTRAMUSCULAR | Status: DC | PRN
  Administered 2019-06-12: 100 mg via INTRAVENOUS

## 2019-06-12 MED ORDER — ONDANSETRON HCL 4 MG/2ML IJ SOLN
INTRAMUSCULAR | Status: AC
  Filled 2019-06-12: qty 2

## 2019-06-12 SURGICAL SUPPLY — 26 items
CATH ROBINSON RED A/P 16FR (CATHETERS) ×2 IMPLANT
COVER WAND RF STERILE (DRAPES) ×2 IMPLANT
FILTER UTR ASPR ASSEMBLY (MISCELLANEOUS) ×2 IMPLANT
GAUZE SPONGE 4X4 16PLY XRAY LF (GAUZE/BANDAGES/DRESSINGS) ×2 IMPLANT
GLOVE BIO SURGEON STRL SZ 6.5 (GLOVE) ×2 IMPLANT
GLOVE BIO SURGEON STRL SZ8 (GLOVE) ×4 IMPLANT
GLOVE BIOGEL PI IND STRL 6.5 (GLOVE) ×1 IMPLANT
GLOVE BIOGEL PI IND STRL 7.0 (GLOVE) ×1 IMPLANT
GLOVE BIOGEL PI IND STRL 7.5 (GLOVE) ×1 IMPLANT
GLOVE BIOGEL PI IND STRL 8 (GLOVE) ×2 IMPLANT
GLOVE BIOGEL PI INDICATOR 6.5 (GLOVE) ×1
GLOVE BIOGEL PI INDICATOR 7.0 (GLOVE) ×1
GLOVE BIOGEL PI INDICATOR 7.5 (GLOVE) ×1
GLOVE BIOGEL PI INDICATOR 8 (GLOVE) ×2
GOWN STRL REUS W/TWL XL LVL3 (GOWN DISPOSABLE) ×2 IMPLANT
HOSE CONNECTING 18IN BERKELEY (TUBING) ×2 IMPLANT
KIT BERKELEY 1ST TRI 3/8 NO TR (MISCELLANEOUS) ×2 IMPLANT
KIT BERKELEY 1ST TRIMESTER 3/8 (MISCELLANEOUS) ×2 IMPLANT
KIT TURNOVER CYSTO (KITS) ×2 IMPLANT
NS IRRIG 500ML POUR BTL (IV SOLUTION) IMPLANT
PACK VAGINAL MINOR WOMEN LF (CUSTOM PROCEDURE TRAY) ×2 IMPLANT
PAD OB MATERNITY 4.3X12.25 (PERSONAL CARE ITEMS) ×2 IMPLANT
TOWEL OR 17X26 10 PK STRL BLUE (TOWEL DISPOSABLE) ×2 IMPLANT
TRAP TISSUE FILTER (MISCELLANEOUS) IMPLANT
VACURETTE 9 RIGID CVD (CANNULA) ×2 IMPLANT
WATER STERILE IRR 500ML POUR (IV SOLUTION) ×2 IMPLANT

## 2019-06-12 NOTE — Anesthesia Procedure Notes (Signed)
Procedure Name: LMA Insertion Date/Time: 06/12/2019 7:22 AM Performed by: Francie Massing, CRNA Pre-anesthesia Checklist: Patient identified, Emergency Drugs available, Suction available and Patient being monitored Patient Re-evaluated:Patient Re-evaluated prior to induction Oxygen Delivery Method: Circle system utilized Preoxygenation: Pre-oxygenation with 100% oxygen Induction Type: IV induction Ventilation: Mask ventilation without difficulty LMA: LMA inserted LMA Size: 4.0 Number of attempts: 1 Airway Equipment and Method: Bite block Placement Confirmation: positive ETCO2 Tube secured with: Tape Dental Injury: Teeth and Oropharynx as per pre-operative assessment

## 2019-06-12 NOTE — Discharge Instructions (Signed)

## 2019-06-12 NOTE — Anesthesia Postprocedure Evaluation (Signed)
Anesthesia Post Note  Patient: Felicia Woodard  Procedure(s) Performed: DILATATION AND EVACUATION (N/A Vagina )     Patient location during evaluation: PACU Anesthesia Type: General Level of consciousness: sedated and patient cooperative Pain management: pain level controlled Vital Signs Assessment: post-procedure vital signs reviewed and stable Respiratory status: spontaneous breathing Cardiovascular status: stable Anesthetic complications: no    Last Vitals:  Vitals:   06/12/19 0830 06/12/19 0932  BP: 111/72 117/77  Pulse: 82 82  Resp: 16 18  Temp:  36.5 C  SpO2: 96% 100%    Last Pain:  Vitals:   06/12/19 0932  TempSrc:   PainSc: 3                  Lewie Loron

## 2019-06-12 NOTE — Op Note (Signed)
Name: Felicia Woodard  Age: 29 y.o.  Date of Birth: [birth year]  Medical Record #: [id number]   Operative Note   Preoperative Diagnosis:  Incomplete spontaneous abortion Procedure: Suction Dilation and Curettage Postoperative Diagnosis: same. Surgeon: [name], MD Anesthesia: General LMA, local with 0.25% bupivacaine Specimen: Products of conception Estimated Blood Loss:  25 mL IV Fluids: 800 mL Complication: none. Date: 06/12/19   DESCRIPTION OF PROCEDURE:  Under general anesthesia,  [name] was placed in the dorsolithotomy position with legs in cane stirrups.  A surgical team time out was performed to verify and agree on procedure and patient consent. A bimanual exam was performed.  The patient was prepped and draped in the usual sterile fashion. SCDs were applied.  She received 200 mg of oral doxycycline prior to the procedure.     The anterior cervix was grasped with a single-toothed tenaculum.  A paracervical block was applied at the cervicovaginal junction at 4 and 8 o'clock positions, injecting 10 mL of 0.25% bupivacaine.  The uterine cavity was very gently sounded to establish a measurement of uterine cavity depth of 9 cm.  The cervix was gently dilated using a progressive series of Pratt dilators to size 25.  A number 9 mm suction curette was introduced and an expected amount of uterine contents were evacuated. A thorough curettage was productive of benign appearing tissue in a total of three gentle passes.  At the conclusion of the suction curettage, the endometrial cavity was gently explored with a hand-held curette to verify a clean cavity with uterine cry noted circumferentially without over aggressive curettage and without concern for uterine perforation.  The tenaculum was removed and silver nitrate cautery and pressure with a sponge stick were used at the tenaculum site and it appeared hemostatic. Sponge counts were correct and hemostasis was observed at the conclusion  of the procedure. The patient tolerated the procedure very well and was brought to recovery room in stable condition.  [name], M.D. 06/12/19

## 2019-06-12 NOTE — H&P (Signed)
Pre op H&P update  Felicia Woodard is met prior to the proposed procedure and has no changes to her medical history or physical exam since her preoperative visit.  She agrees to proceed with the suction dilation and curettage ("dilation and evacuation").  All questions are answered.  Joseph Pierini, M.D. 06/12/2019

## 2019-06-12 NOTE — Transfer of Care (Signed)
Immediate Anesthesia Transfer of Care Note  Patient: Felicia Woodard  Procedure(s) Performed: Procedure(s) (LRB): DILATATION AND EVACUATION (N/A)  Patient Location: PACU  Anesthesia Type: General  Level of Consciousness: awake, oriented, sedated and patient cooperative  Airway & Oxygen Therapy: Patient Spontanous Breathing and Patient connected to face mask oxygen  Post-op Assessment: Report given to PACU RN and Post -op Vital signs reviewed and stable  Post vital signs: Reviewed and stable  Complications: No apparent anesthesia complications Last Vitals:  Vitals Value Taken Time  BP 120/66 06/12/19 0801  Temp    Pulse 102 06/12/19 0803  Resp 17 06/12/19 0803  SpO2 95 % 06/12/19 0803  Vitals shown include unvalidated device data.  Last Pain:  Vitals:   06/12/19 0616  TempSrc: Oral  PainSc: 3       Patients Stated Pain Goal: 7 (06/12/19 6067)

## 2019-06-16 LAB — SURGICAL PATHOLOGY

## 2019-06-26 ENCOUNTER — Other Ambulatory Visit: Payer: Self-pay

## 2019-06-26 ENCOUNTER — Ambulatory Visit (INDEPENDENT_AMBULATORY_CARE_PROVIDER_SITE_OTHER): Payer: Medicaid Other | Admitting: Obstetrics and Gynecology

## 2019-06-26 ENCOUNTER — Encounter: Payer: Self-pay | Admitting: Obstetrics and Gynecology

## 2019-06-26 VITALS — BP 120/76

## 2019-06-26 DIAGNOSIS — O034 Incomplete spontaneous abortion without complication: Secondary | ICD-10-CM

## 2019-06-26 NOTE — Patient Instructions (Signed)
Start taking a prenatal vitamin when sexually active again/trying to conceive Gradually increase your dose of metformin back to the original maintenance dose (start with 500 mg daily for the first week, then increase from there) Consider taking baby aspirin 81 mg daily when you find out you are pregnant

## 2019-06-26 NOTE — Progress Notes (Signed)
   Felicia Woodard  Nov 26, 1990 [id number]  HPI The patient is a 29 y.o. [id number] who presents today for a postoperative follow-up after a suction D&C/D&E on 06/12/2019 for an incomplete SAB.  She has been doing well with complete resolution of cramping and the slight amount of vaginal bleeding had resolved by earlier this week.  She has no concerns at this time.  Physical Exam  BP 120/76   LMP 02/18/2019 Comment: 06-09-2019  missed ab  General: Pleasant female, no acute distress, alert and oriented PELVIC EXAM: Deferred  Assessment Postoperative exam for [facility]  Plan The patient is doing well and meeting all postoperative milestones.  I reviewed the pathology report from the [facility] indicating products of conception.  I would recommend that she allow menses to resume for one cycle before attempting to conceive again.  If she goes 3 months without a period I would recommend she return to the office for evaluation.  She may resume Metformin and I recommended gradually increasing her dose to her previous maintenance dose for PCOS to lessen GI side effects.  She may want to consider taking a daily baby aspirin in the first trimester of her next pregnancy as she had slightly elevated anticardiolipin IgM level when checked last month.  I recommended she restart a prenatal vitamin when actively trying to conceive/engaging in sexual intercourse.  She may return to normal activities without restriction and resume normal gynecologic care at this point.   Felicia Woodard 06/26/19

## 2019-07-13 ENCOUNTER — Telehealth: Payer: Self-pay

## 2019-07-13 NOTE — Telephone Encounter (Signed)
Patient called asking if Maryelizabeth Rowan, NP can call in an inhaler for her. She said her inhaler is out of date and allergy season is here. She said she has asthma.  (You are backup MD for [place] who is off this week.)

## 2019-07-15 MED ORDER — ALBUTEROL SULFATE HFA 108 (90 BASE) MCG/ACT IN AERS: 2.0000 | INHALATION_SPRAY | Freq: Four times a day (QID) | RESPIRATORY_TRACT | 1 refills | Status: AC | PRN

## 2019-07-15 MED ORDER — IBUPROFEN 800 MG PO TABS: 800.0000 mg | ORAL_TABLET | Freq: Three times a day (TID) | ORAL | 1 refills | Status: DC | PRN

## 2019-07-15 NOTE — Telephone Encounter (Signed)
Refill has been sent in.  

## 2019-07-27 NOTE — Telephone Encounter (Signed)
I will get started on referral.

## 2019-08-14 ENCOUNTER — Other Ambulatory Visit: Payer: Self-pay | Admitting: Women's Health

## 2019-08-14 NOTE — Telephone Encounter (Signed)
06/26/19 note reads "Start taking a prenatal vitamin when sexually active again/trying to conceive Gradually increase your dose of metformin back to the original maintenance dose (start with 500 mg daily for the first week, then increase from there)."

## 2019-12-03 ENCOUNTER — Other Ambulatory Visit: Payer: Self-pay

## 2019-12-03 ENCOUNTER — Encounter: Payer: Self-pay | Admitting: Obstetrics and Gynecology

## 2019-12-03 DIAGNOSIS — N926 Irregular menstruation, unspecified: Secondary | ICD-10-CM

## 2019-12-03 NOTE — Telephone Encounter (Signed)
Let's have Felicia Woodard come in for a serum beta HCG test. Thanks

## 2019-12-04 ENCOUNTER — Other Ambulatory Visit: Payer: Medicaid Other

## 2019-12-04 ENCOUNTER — Other Ambulatory Visit: Payer: Self-pay

## 2019-12-04 DIAGNOSIS — N926 Irregular menstruation, unspecified: Secondary | ICD-10-CM

## 2019-12-05 LAB — HCG, SERUM, QUALITATIVE: Preg, Serum: NEGATIVE

## 2020-05-07 NOTE — L&D Delivery Note (Addendum)
Delivery Note At 9:08 PM a viable and healthy female was delivered via Vaginal, Spontaneous (Presentation: Left Occiput Anterior).  APGAR: 8, 9; weight 7#11.   Placenta status: Spontaneous, Intact.  Cord: 3 vessels   The patient pushed for 30 minutes and delivered a vigorous female infant in the vertex ROA presentation.  The NICU team was in attendance for the delivery due to the known bilateral fetal hydrothorax.  The infant was passed to the maternal abdomen following delivery.  Following a 1 minute delay, the cord was clamped and cut.  At this time the infant was transferred to the [facility] for further evaluation the NICU team.  The placenta delivered spontaneously, intact, with three-vessel cord.  No lacerations required repair.  All sponge, needle, instrument counts were correct.  EBL 117 cc please.  Baby will be transferred to the NICU for observation.  Anesthesia: Epidural Episiotomy: None Lacerations:  None Suture Repair:  NA Est. Blood Loss (mL): 117  Mom to postpartum.  Baby to NICU.  Waynard Reeds 01/25/2021, 9:33 PM

## 2020-06-06 ENCOUNTER — Other Ambulatory Visit (HOSPITAL_COMMUNITY): Payer: Self-pay | Admitting: Obstetrics and Gynecology

## 2020-06-06 MED FILL — ULTICARE SYR 1 ML 30GX5/16: 30G X 5/16" | 30 days supply | Qty: 60 | Fill #0

## 2020-06-06 MED FILL — HEPARIN SOD 5,000 UNIT/ML V: 5000 | 30 days supply | Qty: 60 | Fill #0

## 2020-06-28 MED FILL — HEPARIN SOD 5,000 UNIT/ML V: 5000 | 30 days supply | Qty: 60 | Fill #1

## 2020-06-29 MED FILL — ULTICARE SYR 1 ML 30GX5/16: 30G X 5/16" | 30 days supply | Qty: 60 | Fill #1

## 2020-07-29 ENCOUNTER — Encounter: Payer: Self-pay | Admitting: Obstetrics and Gynecology

## 2020-07-29 LAB — OB RESULTS CONSOLE ABO/RH: RH Type: POSITIVE

## 2020-07-29 LAB — OB RESULTS CONSOLE GC/CHLAMYDIA
Chlamydia: NEGATIVE
Gonorrhea: NEGATIVE

## 2020-07-29 LAB — OB RESULTS CONSOLE HEPATITIS B SURFACE ANTIGEN: Hepatitis B Surface Ag: NEGATIVE

## 2020-07-29 LAB — OB RESULTS CONSOLE RPR: RPR: NONREACTIVE

## 2020-07-29 LAB — OB RESULTS CONSOLE RUBELLA ANTIBODY, IGM: Rubella: IMMUNE

## 2020-07-29 LAB — OB RESULTS CONSOLE ANTIBODY SCREEN: Antibody Screen: NEGATIVE

## 2020-07-29 LAB — OB RESULTS CONSOLE HIV ANTIBODY (ROUTINE TESTING): HIV: NONREACTIVE

## 2020-08-02 ENCOUNTER — Other Ambulatory Visit: Payer: Self-pay | Admitting: Obstetrics and Gynecology

## 2020-08-02 DIAGNOSIS — Z3A19 19 weeks gestation of pregnancy: Secondary | ICD-10-CM

## 2020-08-02 DIAGNOSIS — D6859 Other primary thrombophilia: Secondary | ICD-10-CM

## 2020-08-02 DIAGNOSIS — Z363 Encounter for antenatal screening for malformations: Secondary | ICD-10-CM

## 2020-08-03 ENCOUNTER — Encounter: Payer: Self-pay | Admitting: *Deleted

## 2020-08-05 ENCOUNTER — Encounter: Payer: Self-pay | Admitting: *Deleted

## 2020-08-05 ENCOUNTER — Ambulatory Visit: Payer: Medicaid Other | Attending: Obstetrics and Gynecology | Admitting: Obstetrics and Gynecology

## 2020-08-05 ENCOUNTER — Other Ambulatory Visit: Payer: Self-pay

## 2020-08-05 ENCOUNTER — Ambulatory Visit: Payer: Medicaid Other | Admitting: *Deleted

## 2020-08-05 VITALS — BP 104/67 | HR 78

## 2020-08-05 DIAGNOSIS — D6859 Other primary thrombophilia: Secondary | ICD-10-CM

## 2020-08-05 DIAGNOSIS — O99111 Other diseases of the blood and blood-forming organs and certain disorders involving the immune mechanism complicating pregnancy, first trimester: Secondary | ICD-10-CM

## 2020-08-05 DIAGNOSIS — Z3A12 12 weeks gestation of pregnancy: Secondary | ICD-10-CM

## 2020-08-05 NOTE — Progress Notes (Signed)
Maternal-Fetal Medicine   Name: Felicia Woodard DOB: 1991/05/02 MRN: [id number] Referring Provider: Derl Barrow, MD  I had the pleasure of seeing Ms. Felicia [facility] today at [facility].  She is G4 P121 at 12-weeks' gestation and is here for consultation. 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 Obstetric history significant for a term vacuum-assisted vaginal delivery in 2018 of a female infant weighing 8 pounds and 4 ounces at birth.  Her pregnancy was uncomplicated. In 2020 and 21 she had early pregnancy losses.  No karyotype analysis was performed. Patient had fertility work-up and reports her sister had anti-beta-2 glycoprotein 1 antibodies. GYN history: No history of abnormal Pap smears or cervical surgeries.  No history of breast disease.  Her previous menstrual cycles were irregular, and patient was taking Metformin before conception. Past medical history: No history of diabetes or hypertension or any other chronic medical conditions.  Patient does not give personal history of venous thromboembolism. Past surgical history: D&C. Medications: Prenatal vitamins, low-dose aspirin, Lovenox 40 mg subcutaneously daily. Allergies: No known drug allergies. Social history: Denies tobacco or drug or alcohol use.  She has been married 8 years and her husband is in good health.  She is a Manufacturing systems engineer. Family history: No history of venous thromboembolism in the family.   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 It has a very weak association with recurrent pregnancy loss and most studies did not show association with RPL. Anticardiolipin, lupus coagulant and beta-2-glycoprotein-1 antibodies were not increased. Patient does not have a personal or family  history of venous thromboembolism.  In the absence of personal or family history of VTE, the likelihood of RPL with this polymorphism is very low and I do NOT recommend prophylactic anticoagulation. I reassured the patient that the chances of miscarriage after 12-weeks' gestation is roughly 5% (1% after 16 weeks).   Patient is understandably anxious about discontinuing and will discuss with you and her family. I discussed the possible side effects of heparin including rashes, thrombocytopenia, and osteoporosis (vertebral fracture in 1% to 2% and is very unlikely with prophylactic dosage).  If patient continues lovenox, unfractionated heparin 10,000 units twice daily should be substituted at 36 weeks' gestation till delivery. Postpartum anticoagulation in the absence of risk factors is not required.   Recommendations: -If patient decides to continue lovenox, it should be continued till 36 weeks and then substituted with unfractionated heparin 10,000 units twice daily. -Postpartum anticoagulation is not necessary unless risk factors are present (cesarean delivery). -Routine prenatal care.   Thank you for consultation. Please contact me if you have any questions.  Consultation including face-to-face counseling 30 minutes.

## 2020-09-12 ENCOUNTER — Ambulatory Visit: Payer: Medicaid Other

## 2020-09-13 ENCOUNTER — Other Ambulatory Visit: Payer: Self-pay

## 2020-09-13 ENCOUNTER — Ambulatory Visit: Payer: Medicaid Other | Admitting: *Deleted

## 2020-09-13 ENCOUNTER — Ambulatory Visit: Payer: Medicaid Other | Attending: Obstetrics and Gynecology

## 2020-09-13 ENCOUNTER — Other Ambulatory Visit: Payer: Self-pay | Admitting: *Deleted

## 2020-09-13 ENCOUNTER — Encounter: Payer: Self-pay | Admitting: *Deleted

## 2020-09-13 VITALS — BP 125/61 | HR 75

## 2020-09-13 DIAGNOSIS — O321XX0 Maternal care for breech presentation, not applicable or unspecified: Secondary | ICD-10-CM

## 2020-09-13 DIAGNOSIS — Z3A18 18 weeks gestation of pregnancy: Secondary | ICD-10-CM

## 2020-09-13 DIAGNOSIS — Z3A19 19 weeks gestation of pregnancy: Secondary | ICD-10-CM | POA: Insufficient documentation

## 2020-09-13 DIAGNOSIS — D6859 Other primary thrombophilia: Secondary | ICD-10-CM | POA: Diagnosis not present

## 2020-09-13 DIAGNOSIS — Z3689 Encounter for other specified antenatal screening: Secondary | ICD-10-CM | POA: Diagnosis present

## 2020-09-13 DIAGNOSIS — Z363 Encounter for antenatal screening for malformations: Secondary | ICD-10-CM | POA: Diagnosis not present

## 2020-09-13 DIAGNOSIS — O99112 Other diseases of the blood and blood-forming organs and certain disorders involving the immune mechanism complicating pregnancy, second trimester: Secondary | ICD-10-CM | POA: Diagnosis not present

## 2020-10-11 ENCOUNTER — Other Ambulatory Visit: Payer: Self-pay

## 2020-10-11 ENCOUNTER — Ambulatory Visit: Payer: Medicaid Other | Attending: Obstetrics

## 2020-10-11 ENCOUNTER — Ambulatory Visit: Payer: Medicaid Other | Admitting: *Deleted

## 2020-10-11 ENCOUNTER — Encounter: Payer: Self-pay | Admitting: *Deleted

## 2020-10-11 VITALS — BP 109/61 | HR 66

## 2020-10-11 DIAGNOSIS — O99112 Other diseases of the blood and blood-forming organs and certain disorders involving the immune mechanism complicating pregnancy, second trimester: Secondary | ICD-10-CM

## 2020-10-11 DIAGNOSIS — D6859 Other primary thrombophilia: Secondary | ICD-10-CM | POA: Diagnosis present

## 2020-10-11 DIAGNOSIS — O322XX0 Maternal care for transverse and oblique lie, not applicable or unspecified: Secondary | ICD-10-CM

## 2020-10-11 DIAGNOSIS — O99119 Other diseases of the blood and blood-forming organs and certain disorders involving the immune mechanism complicating pregnancy, unspecified trimester: Secondary | ICD-10-CM | POA: Diagnosis present

## 2020-10-11 DIAGNOSIS — Z362 Encounter for other antenatal screening follow-up: Secondary | ICD-10-CM

## 2020-10-11 DIAGNOSIS — Z3A22 22 weeks gestation of pregnancy: Secondary | ICD-10-CM

## 2020-10-12 ENCOUNTER — Other Ambulatory Visit: Payer: Self-pay | Admitting: *Deleted

## 2020-10-12 DIAGNOSIS — O99119 Other diseases of the blood and blood-forming organs and certain disorders involving the immune mechanism complicating pregnancy, unspecified trimester: Secondary | ICD-10-CM

## 2020-11-17 LAB — OB RESULTS CONSOLE HGB/HCT, BLOOD
HCT: 31 (ref 29–41)
Hemoglobin: 10

## 2020-11-17 LAB — OB RESULTS CONSOLE PLATELET COUNT: Platelets: 440

## 2020-12-20 ENCOUNTER — Ambulatory Visit: Payer: Medicaid Other

## 2020-12-20 ENCOUNTER — Ambulatory Visit: Payer: Medicaid Other | Attending: Maternal & Fetal Medicine

## 2020-12-21 ENCOUNTER — Other Ambulatory Visit: Payer: Self-pay | Admitting: Maternal & Fetal Medicine

## 2020-12-21 ENCOUNTER — Ambulatory Visit: Payer: Medicaid Other | Admitting: *Deleted

## 2020-12-21 ENCOUNTER — Other Ambulatory Visit: Payer: Self-pay | Admitting: *Deleted

## 2020-12-21 ENCOUNTER — Ambulatory Visit (HOSPITAL_BASED_OUTPATIENT_CLINIC_OR_DEPARTMENT_OTHER): Payer: Medicaid Other | Admitting: Obstetrics

## 2020-12-21 ENCOUNTER — Other Ambulatory Visit: Payer: Self-pay

## 2020-12-21 ENCOUNTER — Encounter: Payer: Self-pay | Admitting: *Deleted

## 2020-12-21 ENCOUNTER — Ambulatory Visit: Payer: Medicaid Other

## 2020-12-21 ENCOUNTER — Ambulatory Visit: Payer: Medicaid Other | Attending: Obstetrics and Gynecology

## 2020-12-21 VITALS — BP 108/68 | HR 82

## 2020-12-21 DIAGNOSIS — O283 Abnormal ultrasonic finding on antenatal screening of mother: Secondary | ICD-10-CM

## 2020-12-21 DIAGNOSIS — D6859 Other primary thrombophilia: Secondary | ICD-10-CM

## 2020-12-21 DIAGNOSIS — O99113 Other diseases of the blood and blood-forming organs and certain disorders involving the immune mechanism complicating pregnancy, third trimester: Secondary | ICD-10-CM

## 2020-12-21 DIAGNOSIS — O99119 Other diseases of the blood and blood-forming organs and certain disorders involving the immune mechanism complicating pregnancy, unspecified trimester: Secondary | ICD-10-CM

## 2020-12-21 DIAGNOSIS — O359XX0 Maternal care for (suspected) fetal abnormality and damage, unspecified, not applicable or unspecified: Secondary | ICD-10-CM | POA: Insufficient documentation

## 2020-12-21 DIAGNOSIS — Z3A32 32 weeks gestation of pregnancy: Secondary | ICD-10-CM | POA: Insufficient documentation

## 2020-12-21 DIAGNOSIS — Z362 Encounter for other antenatal screening follow-up: Secondary | ICD-10-CM | POA: Diagnosis not present

## 2020-12-21 NOTE — Progress Notes (Unsigned)
u

## 2020-12-21 NOTE — Progress Notes (Signed)
MFM Note  Felicia Woodard was seen for an ultrasound exam as fluid was noted in the fetal thorax on an ultrasound performed in your office earlier today.  The patient reports that her pregnancy has been proceeding well without any other issues.  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  She denies any history of a prior thromboembolic event.  She was informed that the fetal growth and amniotic fluid level appears appropriate for her gestational age.  Bilateral fetal hydrothorax was noted on today's exam.  There appears to be a decent amount of fetal lung tissue present.  There was no mediastinal shift of the heart.  I reviewed the images from her prior ultrasound exams performed in our office.  There were no signs of hydrothorax noted earlier in her pregnancy.  The views of the fetal heart appeared within normal limits without any signs of a heart defect.  As fluid was not noted in any other fetal body cavity, this is most likely a case of isolated fetal hydrothorax and not fetal hydrops (which would involve abnormal fluid in 2 or more body cavities).  The causes of fetal hydrothorax including a fetal heart defect/arrhythmia, structural malformations such as a fetal chest mass, chromosomal abnormalities, hematologic conditions causing fetal anemia, infections including CMV and toxoplasmosis, and anomalies in the umbilical cord and placenta were discussed.  As the views of the fetal heart were within normal limits earlier in her pregnancy, she was reassured that a fetal heart defect is probably not the cause.  There were no signs of a fetal arrhythmia or a fetal chest mass noted today.    As the patient had a cell free DNA test that indicated a low risk for trisomy 21, 18, and 13, fetal aneuploidy is an unlikely cause.  She had blood work drawn today to screen for a recent CMV or toxoplasmosis infection.  Doppler studies of the middle cerebral arteries  performed today showed a peak systolic velocity that was less than 1.5 MoM, indicating that the fetus is not anemic at this time.  Doppler studies of the umbilical arteries performed today showed a normal S/D ratio without any signs of absent or reversed end-diastolic flow, indicating that the risk of abnormalities in placental or umbilical function is low.  A biophysical profile performed today was 8 out of 8.  Vigorous fetal movements and fetal breathing movements were noted throughout today's exam.  The patient and her husband were advised that at this point in her pregnancy, we will continue close observation with twice weekly fetal testing (one NST and one BPP with MCA doppler studies each week) in order to help her reach a more optimal gestational age for delivery.  As long as the fetal testing is within normal limits, the goal for her delivery would be at around 36 weeks.  She should receive a course of antenatal corticosteroids prior to delivery.  I will inquire with the NICU team to determine if they are comfortable with managing a baby with isolated hydrothorax after birth.  The patient and her husband understand that should the NICU be uncomfortable with the management, that she may have to deliver at another institution with pediatric subspecialists available.  As the indications for her to be on prophylactic Lovenox were weak and as it is uncertain when she will require delivery, she was advised to discontinue prophylactic Lovenox today.    The patient will return to our office in 2 days  for an NST.  I will review the CMV and toxoplasmosis blood results with her at that time.  Fetal kick count instructions were reviewed.  We have scheduled twice weekly fetal assessments in our office for the next 3 weeks.  The patient and her husband stated that all of their questions have been answered.  A total of 45 minutes was spent counseling and coordinating the care for this patient.  Greater  than 50% of the time was spent in direct face-to-face contact.  Recommendations:  NST in 2 days Twice weekly fetal testing (one NST and one BPP with MCA Doppler study) CMV and toxoplasmosis testing Antenatal corticosteroids prior to delivery Delivery at 36 weeks should fetal testing be within normal limits Discontinue Lovenox

## 2020-12-22 LAB — CMV ANTIBODY, IGG (EIA): CMV Ab - IgG: <0.6 U/mL (ref 0.00–0.59)

## 2020-12-22 LAB — CMV IGM: CMV IgM Ser EIA-aCnc: <30 AU/mL (ref 0.0–29.9)

## 2020-12-22 LAB — INFECT DISEASE AB IGM REFLEX 1

## 2020-12-22 LAB — TOXOPLASMA GONDII ANTIBODY, IGM: Toxoplasma Antibody- IgM: <3 AU/mL (ref 0.0–7.9)

## 2020-12-22 LAB — TOXOPLASMA GONDII ANTIBODY, IGG: Toxoplasma IgG Ratio: <3 IU/mL (ref 0.0–7.1)

## 2020-12-23 ENCOUNTER — Ambulatory Visit: Payer: Medicaid Other | Admitting: *Deleted

## 2020-12-23 ENCOUNTER — Encounter: Payer: Self-pay | Admitting: *Deleted

## 2020-12-23 ENCOUNTER — Ambulatory Visit: Payer: Medicaid Other | Attending: Obstetrics | Admitting: *Deleted

## 2020-12-23 ENCOUNTER — Other Ambulatory Visit: Payer: Self-pay

## 2020-12-23 VITALS — BP 119/67 | HR 87

## 2020-12-23 DIAGNOSIS — O283 Abnormal ultrasonic finding on antenatal screening of mother: Secondary | ICD-10-CM | POA: Diagnosis present

## 2020-12-23 DIAGNOSIS — Z3A32 32 weeks gestation of pregnancy: Secondary | ICD-10-CM | POA: Insufficient documentation

## 2020-12-23 DIAGNOSIS — O358XX0 Maternal care for other (suspected) fetal abnormality and damage, not applicable or unspecified: Secondary | ICD-10-CM

## 2020-12-23 NOTE — Procedures (Signed)
Felicia Woodard 09/21/1990 [redacted]w[redacted]d  Fetus A Non-Stress Test Interpretation for 12/23/20  Indication:  Fetal hydrothorax  Fetal Heart Rate A Mode: External Baseline Rate (A): 125 bpm Variability: Moderate Accelerations: 15 x 15 Decelerations: None Multiple birth?: No  Uterine Activity Mode: Palpation, Toco Contraction Frequency (min): UI Contraction Quality: Mild Resting Tone Palpated: Relaxed Resting Time: Adequate  Interpretation (Fetal Testing) Nonstress Test Interpretation: Reactive Comments: Dr. Grace Bushy reviewed tracing.

## 2020-12-26 ENCOUNTER — Ambulatory Visit: Payer: Medicaid Other | Attending: Obstetrics | Admitting: Obstetrics

## 2020-12-26 ENCOUNTER — Ambulatory Visit: Payer: Medicaid Other | Admitting: *Deleted

## 2020-12-26 ENCOUNTER — Other Ambulatory Visit: Payer: Self-pay

## 2020-12-26 ENCOUNTER — Encounter: Payer: Self-pay | Admitting: *Deleted

## 2020-12-26 VITALS — BP 111/71 | HR 105

## 2020-12-26 DIAGNOSIS — D6859 Other primary thrombophilia: Secondary | ICD-10-CM

## 2020-12-26 DIAGNOSIS — O283 Abnormal ultrasonic finding on antenatal screening of mother: Secondary | ICD-10-CM | POA: Diagnosis not present

## 2020-12-26 DIAGNOSIS — O99119 Other diseases of the blood and blood-forming organs and certain disorders involving the immune mechanism complicating pregnancy, unspecified trimester: Secondary | ICD-10-CM

## 2020-12-26 DIAGNOSIS — O358XX0 Maternal care for other (suspected) fetal abnormality and damage, not applicable or unspecified: Secondary | ICD-10-CM | POA: Insufficient documentation

## 2020-12-26 DIAGNOSIS — Z3A33 33 weeks gestation of pregnancy: Secondary | ICD-10-CM | POA: Diagnosis not present

## 2020-12-26 NOTE — Procedures (Signed)
SILVA AAMODT Aug 05, 1990 [redacted]w[redacted]d  Fetus A Non-Stress Test Interpretation for 12/26/20  Indication:  Bilateral hydrothorax  Fetal Heart Rate A Mode: External Baseline Rate (A): 125 bpm Variability: Moderate Accelerations: 15 x 15 Decelerations: None Multiple birth?: No  Uterine Activity Mode: Palpation, Toco Contraction Frequency (min): UI Contraction Quality: Mild Resting Tone Palpated: Relaxed Resting Time: Adequate  Interpretation (Fetal Testing) Nonstress Test Interpretation: Reactive Comments: Dr. Parke Poisson reviewed tracing.

## 2020-12-29 ENCOUNTER — Ambulatory Visit: Payer: Medicaid Other | Attending: Obstetrics

## 2020-12-29 ENCOUNTER — Other Ambulatory Visit: Payer: Self-pay | Admitting: Obstetrics

## 2020-12-29 ENCOUNTER — Ambulatory Visit: Payer: Medicaid Other | Admitting: *Deleted

## 2020-12-29 ENCOUNTER — Encounter: Payer: Self-pay | Admitting: *Deleted

## 2020-12-29 ENCOUNTER — Other Ambulatory Visit: Payer: Self-pay

## 2020-12-29 VITALS — BP 114/70 | HR 94

## 2020-12-29 DIAGNOSIS — O359XX0 Maternal care for (suspected) fetal abnormality and damage, unspecified, not applicable or unspecified: Secondary | ICD-10-CM | POA: Diagnosis present

## 2020-12-29 DIAGNOSIS — O99113 Other diseases of the blood and blood-forming organs and certain disorders involving the immune mechanism complicating pregnancy, third trimester: Secondary | ICD-10-CM

## 2020-12-29 DIAGNOSIS — O99119 Other diseases of the blood and blood-forming organs and certain disorders involving the immune mechanism complicating pregnancy, unspecified trimester: Secondary | ICD-10-CM | POA: Diagnosis present

## 2020-12-29 DIAGNOSIS — D6859 Other primary thrombophilia: Secondary | ICD-10-CM

## 2020-12-29 DIAGNOSIS — O283 Abnormal ultrasonic finding on antenatal screening of mother: Secondary | ICD-10-CM

## 2020-12-29 DIAGNOSIS — Z3A33 33 weeks gestation of pregnancy: Secondary | ICD-10-CM

## 2021-01-02 ENCOUNTER — Ambulatory Visit: Payer: Medicaid Other | Attending: Obstetrics | Admitting: *Deleted

## 2021-01-02 ENCOUNTER — Encounter: Payer: Self-pay | Admitting: *Deleted

## 2021-01-02 ENCOUNTER — Other Ambulatory Visit: Payer: Self-pay

## 2021-01-02 ENCOUNTER — Ambulatory Visit (HOSPITAL_BASED_OUTPATIENT_CLINIC_OR_DEPARTMENT_OTHER): Payer: Medicaid Other | Admitting: *Deleted

## 2021-01-02 VITALS — BP 107/73 | HR 108

## 2021-01-02 DIAGNOSIS — O358XX0 Maternal care for other (suspected) fetal abnormality and damage, not applicable or unspecified: Secondary | ICD-10-CM | POA: Insufficient documentation

## 2021-01-02 DIAGNOSIS — O283 Abnormal ultrasonic finding on antenatal screening of mother: Secondary | ICD-10-CM

## 2021-01-02 DIAGNOSIS — Z3A34 34 weeks gestation of pregnancy: Secondary | ICD-10-CM

## 2021-01-02 NOTE — Procedures (Signed)
Felicia Woodard [birth year] [redacted]w[redacted]d  Fetus A Non-Stress Test Interpretation for 01/02/21  Indication:  Fetal Hydrothorax  Fetal Heart Rate A Mode: External Baseline Rate (A): 130 bpm Variability: Moderate Accelerations: 15 x 15 Decelerations: None Multiple birth?: No  Uterine Activity Mode: Palpation, Toco Contraction Frequency (min): ui Resting Tone Palpated: Relaxed  Interpretation (Fetal Testing) Nonstress Test Interpretation: Reactive Overall Impression: Reassuring for gestational age Comments: Dr. Judeth Cornfield reviewed tracing.

## 2021-01-05 ENCOUNTER — Ambulatory Visit: Payer: Medicaid Other | Attending: Obstetrics and Gynecology

## 2021-01-05 ENCOUNTER — Other Ambulatory Visit: Payer: Self-pay

## 2021-01-05 ENCOUNTER — Ambulatory Visit: Payer: Medicaid Other | Admitting: *Deleted

## 2021-01-05 ENCOUNTER — Encounter: Payer: Self-pay | Admitting: *Deleted

## 2021-01-05 VITALS — BP 116/60 | HR 85

## 2021-01-05 DIAGNOSIS — D6859 Other primary thrombophilia: Secondary | ICD-10-CM | POA: Diagnosis not present

## 2021-01-05 DIAGNOSIS — Z3A34 34 weeks gestation of pregnancy: Secondary | ICD-10-CM

## 2021-01-05 DIAGNOSIS — O358XX0 Maternal care for other (suspected) fetal abnormality and damage, not applicable or unspecified: Secondary | ICD-10-CM

## 2021-01-05 DIAGNOSIS — O283 Abnormal ultrasonic finding on antenatal screening of mother: Secondary | ICD-10-CM | POA: Insufficient documentation

## 2021-01-05 DIAGNOSIS — O99113 Other diseases of the blood and blood-forming organs and certain disorders involving the immune mechanism complicating pregnancy, third trimester: Secondary | ICD-10-CM

## 2021-01-06 ENCOUNTER — Other Ambulatory Visit: Payer: Self-pay | Admitting: *Deleted

## 2021-01-06 DIAGNOSIS — O283 Abnormal ultrasonic finding on antenatal screening of mother: Secondary | ICD-10-CM

## 2021-01-10 ENCOUNTER — Ambulatory Visit: Payer: Medicaid Other | Admitting: *Deleted

## 2021-01-10 ENCOUNTER — Other Ambulatory Visit: Payer: Self-pay

## 2021-01-10 ENCOUNTER — Ambulatory Visit: Payer: Medicaid Other | Attending: Obstetrics | Admitting: *Deleted

## 2021-01-10 VITALS — BP 112/63 | HR 87

## 2021-01-10 DIAGNOSIS — O35CXX0 Maternal care for other (suspected) fetal abnormality and damage, fetal pulmonary anomalies, not applicable or unspecified: Secondary | ICD-10-CM

## 2021-01-10 DIAGNOSIS — O26893 Other specified pregnancy related conditions, third trimester: Secondary | ICD-10-CM | POA: Diagnosis not present

## 2021-01-10 DIAGNOSIS — O283 Abnormal ultrasonic finding on antenatal screening of mother: Secondary | ICD-10-CM

## 2021-01-10 DIAGNOSIS — O358XX0 Maternal care for other (suspected) fetal abnormality and damage, not applicable or unspecified: Secondary | ICD-10-CM

## 2021-01-10 DIAGNOSIS — J948 Other specified pleural conditions: Secondary | ICD-10-CM | POA: Insufficient documentation

## 2021-01-10 DIAGNOSIS — Z3A35 35 weeks gestation of pregnancy: Secondary | ICD-10-CM | POA: Diagnosis not present

## 2021-01-10 NOTE — Procedures (Signed)
Felicia Woodard 1990/09/14 [redacted]w[redacted]d  Fetus A Non-Stress Test Interpretation for 01/10/21  Indication:  bilateral hydrothorax  Fetal Heart Rate A Mode: External Baseline Rate (A): 130 bpm Variability: Moderate Accelerations: 15 x 15 Decelerations: None Multiple birth?: No  Uterine Activity Mode: Palpation, Toco Contraction Frequency (min): ui Resting Tone Palpated: Relaxed  Interpretation (Fetal Testing) Nonstress Test Interpretation: Reactive Overall Impression: Reassuring for gestational age Comments: Dr. Judeth Cornfield reviewed tracing

## 2021-01-11 LAB — OB RESULTS CONSOLE GBS: GBS: NEGATIVE

## 2021-01-13 ENCOUNTER — Ambulatory Visit: Payer: Medicaid Other | Admitting: *Deleted

## 2021-01-13 ENCOUNTER — Encounter (HOSPITAL_COMMUNITY): Payer: Self-pay | Admitting: *Deleted

## 2021-01-13 ENCOUNTER — Encounter: Payer: Self-pay | Admitting: *Deleted

## 2021-01-13 ENCOUNTER — Ambulatory Visit: Payer: Medicaid Other | Attending: Obstetrics

## 2021-01-13 ENCOUNTER — Telehealth (HOSPITAL_COMMUNITY): Payer: Self-pay | Admitting: *Deleted

## 2021-01-13 ENCOUNTER — Other Ambulatory Visit: Payer: Self-pay

## 2021-01-13 ENCOUNTER — Encounter (HOSPITAL_COMMUNITY): Payer: Self-pay

## 2021-01-13 VITALS — BP 120/82 | HR 80

## 2021-01-13 DIAGNOSIS — D6859 Other primary thrombophilia: Secondary | ICD-10-CM | POA: Insufficient documentation

## 2021-01-13 DIAGNOSIS — O283 Abnormal ultrasonic finding on antenatal screening of mother: Secondary | ICD-10-CM | POA: Insufficient documentation

## 2021-01-13 DIAGNOSIS — O99113 Other diseases of the blood and blood-forming organs and certain disorders involving the immune mechanism complicating pregnancy, third trimester: Secondary | ICD-10-CM | POA: Insufficient documentation

## 2021-01-13 DIAGNOSIS — Z3A35 35 weeks gestation of pregnancy: Secondary | ICD-10-CM

## 2021-01-13 DIAGNOSIS — O358XX0 Maternal care for other (suspected) fetal abnormality and damage, not applicable or unspecified: Secondary | ICD-10-CM | POA: Diagnosis not present

## 2021-01-13 NOTE — Telephone Encounter (Signed)
Preadmission screen  

## 2021-01-16 ENCOUNTER — Encounter: Payer: Self-pay | Admitting: *Deleted

## 2021-01-16 ENCOUNTER — Ambulatory Visit: Payer: Medicaid Other | Attending: Maternal & Fetal Medicine | Admitting: *Deleted

## 2021-01-16 ENCOUNTER — Ambulatory Visit (HOSPITAL_BASED_OUTPATIENT_CLINIC_OR_DEPARTMENT_OTHER): Payer: Medicaid Other | Admitting: *Deleted

## 2021-01-16 ENCOUNTER — Other Ambulatory Visit: Payer: Self-pay

## 2021-01-16 VITALS — BP 117/67 | HR 74

## 2021-01-16 DIAGNOSIS — Z3A36 36 weeks gestation of pregnancy: Secondary | ICD-10-CM

## 2021-01-16 DIAGNOSIS — O283 Abnormal ultrasonic finding on antenatal screening of mother: Secondary | ICD-10-CM

## 2021-01-16 DIAGNOSIS — O358XX0 Maternal care for other (suspected) fetal abnormality and damage, not applicable or unspecified: Secondary | ICD-10-CM

## 2021-01-16 NOTE — Procedures (Signed)
Felicia Woodard Nov 13, 1990 104w1d  Fetus A Non-Stress Test Interpretation for 01/16/21  Indication:  Fetal Hydrothorax  Fetal Heart Rate A Mode: External Baseline Rate (A): 120 bpm Variability: Moderate Accelerations: 15 x 15 Decelerations: None Multiple birth?: No  Uterine Activity Mode: Palpation, Toco Contraction Frequency (min): ui Resting Tone Palpated: Relaxed  Interpretation (Fetal Testing) Nonstress Test Interpretation: Reactive Overall Impression: Reassuring for gestational age Comments: Dr. Grace Bushy reviewed tracing.

## 2021-01-19 ENCOUNTER — Encounter: Payer: Self-pay | Admitting: *Deleted

## 2021-01-19 ENCOUNTER — Other Ambulatory Visit: Payer: Self-pay

## 2021-01-19 ENCOUNTER — Other Ambulatory Visit: Payer: Self-pay | Admitting: Obstetrics and Gynecology

## 2021-01-19 ENCOUNTER — Ambulatory Visit: Payer: Medicaid Other | Admitting: *Deleted

## 2021-01-19 ENCOUNTER — Ambulatory Visit: Payer: Medicaid Other | Attending: Maternal & Fetal Medicine

## 2021-01-19 VITALS — BP 120/70 | HR 94

## 2021-01-19 DIAGNOSIS — O99113 Other diseases of the blood and blood-forming organs and certain disorders involving the immune mechanism complicating pregnancy, third trimester: Secondary | ICD-10-CM

## 2021-01-19 DIAGNOSIS — O358XX0 Maternal care for other (suspected) fetal abnormality and damage, not applicable or unspecified: Secondary | ICD-10-CM

## 2021-01-19 DIAGNOSIS — O283 Abnormal ultrasonic finding on antenatal screening of mother: Secondary | ICD-10-CM

## 2021-01-19 DIAGNOSIS — D6859 Other primary thrombophilia: Secondary | ICD-10-CM

## 2021-01-19 DIAGNOSIS — Z3A36 36 weeks gestation of pregnancy: Secondary | ICD-10-CM | POA: Diagnosis not present

## 2021-01-23 ENCOUNTER — Other Ambulatory Visit: Payer: Self-pay | Admitting: Obstetrics and Gynecology

## 2021-01-24 ENCOUNTER — Inpatient Hospital Stay (HOSPITAL_COMMUNITY): Payer: Medicaid Other

## 2021-01-24 LAB — SARS CORONAVIRUS 2 (TAT 6-24 HRS): SARS Coronavirus 2: POSITIVE — AB

## 2021-01-24 NOTE — Progress Notes (Signed)
Stacy in L&D notified of covid test results.

## 2021-01-25 ENCOUNTER — Other Ambulatory Visit: Payer: Self-pay

## 2021-01-25 ENCOUNTER — Inpatient Hospital Stay (HOSPITAL_COMMUNITY): Payer: Medicaid Other | Admitting: Anesthesiology

## 2021-01-25 ENCOUNTER — Inpatient Hospital Stay (HOSPITAL_COMMUNITY)
Admission: AD | Admit: 2021-01-25 | Discharge: 2021-01-27 | DRG: 806 | Disposition: A | Discharge status: Home / Self Care | Payer: Medicaid Other | Attending: Obstetrics and Gynecology | Admitting: Obstetrics and Gynecology

## 2021-01-25 ENCOUNTER — Encounter (HOSPITAL_COMMUNITY): Payer: Self-pay | Admitting: Obstetrics and Gynecology

## 2021-01-25 ENCOUNTER — Inpatient Hospital Stay (HOSPITAL_COMMUNITY): Payer: Medicaid Other

## 2021-01-25 DIAGNOSIS — O9952 Diseases of the respiratory system complicating childbirth: Secondary | ICD-10-CM | POA: Diagnosis present

## 2021-01-25 DIAGNOSIS — D6859 Other primary thrombophilia: Secondary | ICD-10-CM | POA: Diagnosis present

## 2021-01-25 DIAGNOSIS — Z3A37 37 weeks gestation of pregnancy: Secondary | ICD-10-CM | POA: Diagnosis not present

## 2021-01-25 DIAGNOSIS — Z20822 Contact with and (suspected) exposure to covid-19: Secondary | ICD-10-CM | POA: Diagnosis present

## 2021-01-25 DIAGNOSIS — J45909 Unspecified asthma, uncomplicated: Secondary | ICD-10-CM | POA: Diagnosis present

## 2021-01-25 DIAGNOSIS — O358XX0 Maternal care for other (suspected) fetal abnormality and damage, not applicable or unspecified: Principal | ICD-10-CM | POA: Diagnosis present

## 2021-01-25 DIAGNOSIS — O9912 Other diseases of the blood and blood-forming organs and certain disorders involving the immune mechanism complicating childbirth: Secondary | ICD-10-CM | POA: Diagnosis present

## 2021-01-25 DIAGNOSIS — O359XX0 Maternal care for (suspected) fetal abnormality and damage, unspecified, not applicable or unspecified: Secondary | ICD-10-CM | POA: Diagnosis present

## 2021-01-25 DIAGNOSIS — Z23 Encounter for immunization: Secondary | ICD-10-CM

## 2021-01-25 LAB — RESP PANEL BY RT-PCR (FLU A&B, COVID) ARPGX2
Influenza A by PCR: NEGATIVE
Influenza B by PCR: NEGATIVE
SARS Coronavirus 2 by RT PCR: NEGATIVE

## 2021-01-25 LAB — CBC
HCT: 32.9 % — ABNORMAL LOW (ref 36.0–46.0)
Hemoglobin: 10.8 g/dL — ABNORMAL LOW (ref 12.0–15.0)
MCH: 29.6 pg (ref 26.0–34.0)
MCHC: 32.8 g/dL (ref 30.0–36.0)
MCV: 90.1 fL (ref 80.0–100.0)
Platelets: 351 10*3/uL (ref 150–400)
RBC: 3.65 MIL/uL — ABNORMAL LOW (ref 3.87–5.11)
RDW: 13.9 % (ref 11.5–15.5)
WBC: 13.8 10*3/uL — ABNORMAL HIGH (ref 4.0–10.5)
nRBC: 0 % (ref 0.0–0.2)

## 2021-01-25 LAB — RPR: RPR Ser Ql: NONREACTIVE

## 2021-01-25 MED ORDER — LIDOCAINE HCL (PF) 1 % IJ SOLN
INTRAMUSCULAR | Status: DC | PRN
  Administered 2021-01-25: 5 mL via EPIDURAL

## 2021-01-25 MED ORDER — METHYLERGONOVINE MALEATE 0.2 MG/ML IJ SOLN
0.2000 mg | INTRAMUSCULAR | Status: DC | PRN
Start: 2021-01-25 — End: 2021-01-27

## 2021-01-25 MED ORDER — MISOPROSTOL 25 MCG QUARTER TABLET
25.0000 ug | ORAL_TABLET | ORAL | Status: DC | PRN
  Administered 2021-01-25: 25 ug via VAGINAL
  Filled 2021-01-25 (×2): qty 1

## 2021-01-25 MED ORDER — ONDANSETRON HCL 4 MG/2ML IJ SOLN: 4.0000 mg | INTRAMUSCULAR | Status: DC | PRN

## 2021-01-25 MED ORDER — PRENATAL MULTIVITAMIN CH
1.0000 | ORAL_TABLET | Freq: Every day | ORAL | Status: DC
  Administered 2021-01-26 – 2021-01-27 (×2): 1 via ORAL
  Filled 2021-01-25 (×2): qty 1

## 2021-01-25 MED ORDER — LACTATED RINGERS IV SOLN
500.0000 mL | Freq: Once | INTRAVENOUS | Status: AC
  Administered 2021-01-25: 500 mL via INTRAVENOUS

## 2021-01-25 MED ORDER — COCONUT OIL OIL
1.0000 "application " | TOPICAL_OIL | Status: DC | PRN
  Administered 2021-01-26: 1 via TOPICAL

## 2021-01-25 MED ORDER — FENTANYL-BUPIVACAINE-NACL 0.5-0.125-0.9 MG/250ML-% EP SOLN
12.0000 mL/h | EPIDURAL | Status: DC | PRN
  Administered 2021-01-25: 12 mL/h via EPIDURAL
  Filled 2021-01-25: qty 250

## 2021-01-25 MED ORDER — ONDANSETRON HCL 4 MG PO TABS: 4.0000 mg | ORAL_TABLET | ORAL | Status: DC | PRN

## 2021-01-25 MED ORDER — TERBUTALINE SULFATE 1 MG/ML IJ SOLN: 0.2500 mg | Freq: Once | INTRAMUSCULAR | Status: DC | PRN

## 2021-01-25 MED ORDER — LACTATED RINGERS IV SOLN
500.0000 mL | INTRAVENOUS | Status: DC | PRN
Start: 2021-01-25 — End: 2021-01-25
  Administered 2021-01-25: 1000 mL via INTRAVENOUS

## 2021-01-25 MED ORDER — METHYLERGONOVINE MALEATE 0.2 MG PO TABS: 0.2000 mg | ORAL_TABLET | ORAL | Status: DC | PRN

## 2021-01-25 MED ORDER — LIDOCAINE HCL (PF) 1 % IJ SOLN: 30.0000 mL | INTRAMUSCULAR | Status: DC | PRN

## 2021-01-25 MED ORDER — OXYTOCIN-SODIUM CHLORIDE 30-0.9 UT/500ML-% IV SOLN
2.5000 [IU]/h | INTRAVENOUS | Status: DC
  Filled 2021-01-25: qty 500

## 2021-01-25 MED ORDER — EPHEDRINE 5 MG/ML INJ: 10.0000 mg | INTRAVENOUS | Status: DC | PRN

## 2021-01-25 MED ORDER — DIPHENHYDRAMINE HCL 50 MG/ML IJ SOLN: 12.5000 mg | INTRAMUSCULAR | Status: DC | PRN

## 2021-01-25 MED ORDER — LACTATED RINGERS IV SOLN: INTRAVENOUS | Status: DC

## 2021-01-25 MED ORDER — PHENYLEPHRINE 40 MCG/ML (10ML) SYRINGE FOR IV PUSH (FOR BLOOD PRESSURE SUPPORT): 80.0000 ug | PREFILLED_SYRINGE | INTRAVENOUS | Status: DC | PRN

## 2021-01-25 MED ORDER — TETANUS-DIPHTH-ACELL PERTUSSIS 5-2.5-18.5 LF-MCG/0.5 IM SUSY: 0.5000 mL | PREFILLED_SYRINGE | Freq: Once | INTRAMUSCULAR | Status: DC

## 2021-01-25 MED ORDER — SOD CITRATE-CITRIC ACID 500-334 MG/5ML PO SOLN: 30.0000 mL | ORAL | Status: DC | PRN

## 2021-01-25 MED ORDER — SIMETHICONE 80 MG PO CHEW: 80.0000 mg | CHEWABLE_TABLET | ORAL | Status: DC | PRN

## 2021-01-25 MED ORDER — IBUPROFEN 600 MG PO TABS
600.0000 mg | ORAL_TABLET | Freq: Four times a day (QID) | ORAL | Status: DC
  Administered 2021-01-26 – 2021-01-27 (×7): 600 mg via ORAL
  Filled 2021-01-25 (×8): qty 1

## 2021-01-25 MED ORDER — OXYCODONE HCL 5 MG PO TABS: 5.0000 mg | ORAL_TABLET | ORAL | Status: DC | PRN

## 2021-01-25 MED ORDER — MISOPROSTOL 50MCG HALF TABLET
50.0000 ug | ORAL_TABLET | Freq: Once | ORAL | Status: AC
  Administered 2021-01-25: 50 ug via BUCCAL

## 2021-01-25 MED ORDER — ZOLPIDEM TARTRATE 5 MG PO TABS: 5.0000 mg | ORAL_TABLET | Freq: Every evening | ORAL | Status: DC | PRN

## 2021-01-25 MED ORDER — OXYCODONE-ACETAMINOPHEN 5-325 MG PO TABS
1.0000 | ORAL_TABLET | ORAL | Status: DC | PRN
Start: 2021-01-25 — End: 2021-01-25

## 2021-01-25 MED ORDER — ACETAMINOPHEN 325 MG PO TABS: 650.0000 mg | ORAL_TABLET | ORAL | Status: DC | PRN

## 2021-01-25 MED ORDER — ONDANSETRON HCL 4 MG/2ML IJ SOLN
4.0000 mg | Freq: Four times a day (QID) | INTRAMUSCULAR | Status: DC | PRN
  Administered 2021-01-25: 4 mg via INTRAVENOUS
  Filled 2021-01-25: qty 2

## 2021-01-25 MED ORDER — LIDOCAINE-EPINEPHRINE (PF) 1.5 %-1:200000 IJ SOLN
INTRAMUSCULAR | Status: DC | PRN
  Administered 2021-01-25: 5 mL via EPIDURAL

## 2021-01-25 MED ORDER — OXYCODONE HCL 5 MG PO TABS: 10.0000 mg | ORAL_TABLET | ORAL | Status: DC | PRN

## 2021-01-25 MED ORDER — OXYTOCIN-SODIUM CHLORIDE 30-0.9 UT/500ML-% IV SOLN: 2.5000 [IU]/h | INTRAVENOUS | Status: DC | PRN

## 2021-01-25 MED ORDER — ALBUTEROL SULFATE (2.5 MG/3ML) 0.083% IN NEBU: 3.0000 mL | INHALATION_SOLUTION | Freq: Four times a day (QID) | RESPIRATORY_TRACT | Status: DC | PRN

## 2021-01-25 MED ORDER — OXYCODONE-ACETAMINOPHEN 5-325 MG PO TABS: 2.0000 | ORAL_TABLET | ORAL | Status: DC | PRN

## 2021-01-25 MED ORDER — OXYTOCIN-SODIUM CHLORIDE 30-0.9 UT/500ML-% IV SOLN
1.0000 m[IU]/min | INTRAVENOUS | Status: DC
  Administered 2021-01-25: 2 m[IU]/min via INTRAVENOUS

## 2021-01-25 MED ORDER — DIBUCAINE (PERIANAL) 1 % EX OINT: 1.0000 "application " | TOPICAL_OINTMENT | CUTANEOUS | Status: DC | PRN

## 2021-01-25 MED ORDER — SENNOSIDES-DOCUSATE SODIUM 8.6-50 MG PO TABS
2.0000 | ORAL_TABLET | Freq: Every day | ORAL | Status: DC
  Administered 2021-01-26 – 2021-01-27 (×2): 2 via ORAL
  Filled 2021-01-25 (×2): qty 2

## 2021-01-25 MED ORDER — WITCH HAZEL-GLYCERIN EX PADS: 1.0000 "application " | MEDICATED_PAD | CUTANEOUS | Status: DC | PRN

## 2021-01-25 MED ORDER — BENZOCAINE-MENTHOL 20-0.5 % EX AERO
1.0000 "application " | INHALATION_SPRAY | CUTANEOUS | Status: DC | PRN
  Administered 2021-01-26: 1 via TOPICAL
  Filled 2021-01-25: qty 56

## 2021-01-25 MED ORDER — DIPHENHYDRAMINE HCL 25 MG PO CAPS: 25.0000 mg | ORAL_CAPSULE | Freq: Four times a day (QID) | ORAL | Status: DC | PRN

## 2021-01-25 MED ORDER — OXYTOCIN BOLUS FROM INFUSION
333.0000 mL | Freq: Once | INTRAVENOUS | Status: AC
Start: 2021-01-25 — End: 2021-01-25
  Administered 2021-01-25: 333 mL via INTRAVENOUS

## 2021-01-25 MED ORDER — FENTANYL CITRATE (PF) 100 MCG/2ML IJ SOLN: 50.0000 ug | INTRAMUSCULAR | Status: DC | PRN

## 2021-01-25 MED ORDER — MISOPROSTOL 50MCG HALF TABLET
ORAL_TABLET | ORAL | Status: AC
  Filled 2021-01-25: qty 1

## 2021-01-25 MED ORDER — ACETAMINOPHEN 325 MG PO TABS
650.0000 mg | ORAL_TABLET | ORAL | Status: DC | PRN
  Administered 2021-01-26 (×3): 650 mg via ORAL
  Filled 2021-01-25 (×3): qty 2

## 2021-01-25 NOTE — Anesthesia Procedure Notes (Signed)
Epidural Patient location during procedure: OB Start time: 01/25/2021 1:35 PM End time: 01/25/2021 1:45 PM  Staffing Anesthesiologist: Leonides Grills, MD Performed: anesthesiologist   Preanesthetic Checklist Completed: patient identified, IV checked, site marked, risks and benefits discussed, monitors and equipment checked, pre-op evaluation and timeout performed  Epidural Patient position: sitting Prep: DuraPrep Patient monitoring: heart rate, cardiac monitor, continuous pulse ox and blood pressure Approach: midline Location: L4-L5 Injection technique: LOR air  Needle:  Needle type: Tuohy  Needle gauge: 18 G Needle length: 9 cm Needle insertion depth: 6 cm Catheter type: closed end Catheter size: 20 Guage Catheter at skin depth: 11 cm Test dose: negative and Other  Assessment Events: blood not aspirated, injection not painful, no injection resistance and negative IV test  Additional Notes Informed consent obtained prior to proceeding including risk of failure, 1% risk of PDPH, risk of minor discomfort and bruising. Discussed alternatives to epidural analgesia and patient desires to proceed.  Timeout performed pre-procedure verifying patient name, procedure, and platelet count.  Patient tolerated procedure well. Reason for block:procedure for pain

## 2021-01-25 NOTE — H&P (Signed)
Felicia Woodard is a 30 y.o. female presenting for IOL for fetal hydrothorax  30 year old gravida para 1-0-2-1 at 37+3 weeks presents for induction of labor for fetal hydrothorax.  Patient has been followed jointly with maternal-fetal medicine. Patient initially saw maternal-fetal medicine for history of thrombophilia for PAI-1 4G/5G: does NOT recommend anticoagulation due to low risk of pregnancy loss.  Patient was initially on prophylactic Lovenox during the pregnancy.  MFM recommended discontinuing this at the time that the fetal hydrothorax was identified.    Bilateral fetal hydrothorax was noted on ultrasound on routine medicine.  Torch titers: Negative hydrothorax has been stable since identification.  She has been following with weekly antenatal testing.  On preadmission testing the patient was COVID-positive.  She did 2 additional COVID home tests that were negative prior to admission.  In discussion with infection prevention, a rapid COVID test was done after she was admitted which was negative and the patient does not require airborne precautions OB History     Gravida  4   Para  1   Term  1   Preterm      AB  2   Living  1      SAB  2   IAB      Ectopic  0   Multiple  0   Live Births  1          Past Medical History:  Diagnosis Date   Family history of adverse reaction to anesthesia    Father had a low grade fever after anesthesia. Patient has received sevoflurane without complication.   History of miscarriage    Mild asthma    inhaler  prn   PCOS (polycystic ovarian syndrome)    Past Surgical History:  Procedure Laterality Date   CYST REMOVAL HAND Left 2007   DILATION AND EVACUATION N/A 06/12/2019   Procedure: DILATATION AND EVACUATION;  Surgeon: [name], MD;  Location: [facility];  Service: Gynecology;  Laterality: N/A;   WISDOM TOOTH EXTRACTION     Family History: family history includes Atrial fibrillation in her paternal aunt;  Colon cancer in her mother; Hypertension in her father; Ovarian cancer in her maternal aunt. Social History:  reports that she has never smoked. She has never used smokeless tobacco. She reports that she does not drink alcohol and does not use drugs.     Maternal Diabetes: No Genetic Screening: Normal Maternal Ultrasounds/Referrals: Other: As above Fetal Ultrasounds or other Referrals:  Referred to Materal Fetal Medicine  Maternal Substance Abuse:  No Significant Maternal Medications:  None Significant Maternal Lab Results:  None Other Comments:  None  Review of Systems History Dilation: 10 Effacement (%): 80 Station: Plus 1 Exam by:: Madalyn Rob, RN Blood pressure 122/73, pulse 89, temperature 98.4 F (36.9 C), temperature source Oral, resp. rate 16, height 5\' 4"  (1.626 m), weight 87.8 kg, last menstrual period 05/09/2020, unknown if currently breastfeeding. Exam Physical Exam  Prenatal labs: ABO, Rh: --/--/A POS (09/21 0042) Antibody: NEG (09/21 0042) Rubella: Immune (03/25 0000) RPR: NON REACTIVE (09/21 0019)  HBsAg: Negative (03/25 0000)  HIV: Non-reactive (03/25 0000)  GBS: Negative/-- (09/07 0000)   Assessment/Plan: 1) Admit 2) Cytotec, AROM/pit for augmentation 3) Epidural on request 4) NICU to attend delivery   01-15-1977 01/25/2021, 9:22 PM

## 2021-01-25 NOTE — Progress Notes (Signed)
Spoke to patient regarding covid retest times 2 that was reported. Patient states those test were at home. Patient advised that at home test results were not admissible at this time.Patient questions what covid positive result would mean for her delivery. NICU stay. Advised patient that she would have to reduce visitors to only one and that NICU requires 10 days between positive test results and visitation. Mother verbalizes understanding and request to be retested at this time. Advised mother that I would contact physician and infection prevention for answers. Charge nurse and physician aware. Waiting to hear back from infection prevention.

## 2021-01-25 NOTE — Progress Notes (Signed)
Copied from Laqueta Linden: Forrestine Him. I reviewed her chart and did not see any further Covid testing. The only other option would be is when she is admitted that a Cepheid test (2 hour turn around time) be done. If that is negative then she would be clear. If it is positive, I can do some further investigation at that time, but she will need Airborne/Contact precautions. Of course any Covid symptoms, in light of her 1 positive test, will require Airborne/Contact precautions. The ordering of the extra Covid test is Provider driven, but that is an option if the Provider would like to do so.

## 2021-01-25 NOTE — Progress Notes (Signed)
Felicia Woodard is a 30 y.o. G4P1021 at [redacted]w[redacted]d by   Subjective: Comfortable with epidural   Objective: BP 108/73   Pulse 84   Temp 98.4 F (36.9 C) (Oral)   Resp 18   Ht 5\' 4"  (1.626 m)   Wt 87.8 kg   LMP 05/09/2020   BMI 33.23 kg/m  No intake/output data recorded. No intake/output data recorded.  FHT:  FHR: 130 bpm, variability: moderate,  accelerations:  Present,  decelerations:  Absent, Cat 1 tracing UC:   regular, every 2-3 minutes SVE:   Dilation: 5 Effacement (%): 80 Station: -2 Exam by:: Dr. [email or web address]  Labs: Lab Results  Component Value Date   WBC 13.8 (H) 01/25/2021   HGB 10.8 (L) 01/25/2021   HCT 32.9 (L) 01/25/2021   MCV 90.1 01/25/2021   PLT 351 01/25/2021    Assessment / Plan: Induction of labor due to fetal hydrothorax,  progressing well on pitocin  Labor: Progressing normally Fetal Wellbeing:  Category I Pain Control:  Epidural   01/27/2021 01/25/2021, 7:57 PM

## 2021-01-25 NOTE — Anesthesia Preprocedure Evaluation (Addendum)
Anesthesia Evaluation  Patient identified by MRN, date of birth, ID band Patient awake    Reviewed: Allergy & Precautions, H&P , NPO status , Patient's Chart, lab work & pertinent test results  History of Anesthesia Complications Negative for: history of anesthetic complications  Airway Mallampati: II  TM Distance: >3 FB Neck ROM: full    Dental no notable dental hx. (+) Teeth Intact   Pulmonary asthma ,    Pulmonary exam normal breath sounds clear to auscultation       Cardiovascular negative cardio ROS Normal cardiovascular exam Rhythm:regular Rate:Normal     Neuro/Psych negative neurological ROS  negative psych ROS   GI/Hepatic negative GI ROS, Neg liver ROS,   Endo/Other  PCOS (polycystic ovarian syndrome)  Renal/GU negative Renal ROS  negative genitourinary   Musculoskeletal   Abdominal   Peds  Hematology  (+) Blood dyscrasia, anemia ,   Anesthesia Other Findings   Reproductive/Obstetrics (+) Pregnancy                            Anesthesia Physical Anesthesia Plan  ASA: 2  Anesthesia Plan: Epidural   Post-op Pain Management:    Induction:   PONV Risk Score and Plan:   Airway Management Planned:   Additional Equipment:   Intra-op Plan:   Post-operative Plan:   Informed Consent: I have reviewed the patients History and Physical, chart, labs and discussed the procedure including the risks, benefits and alternatives for the proposed anesthesia with the patient or authorized representative who has indicated his/her understanding and acceptance.       Plan Discussed with:   Anesthesia Plan Comments:         Anesthesia Quick Evaluation

## 2021-01-26 LAB — CBC
HCT: 30 % — ABNORMAL LOW (ref 36.0–46.0)
Hemoglobin: 10.1 g/dL — ABNORMAL LOW (ref 12.0–15.0)
MCH: 30 pg (ref 26.0–34.0)
MCHC: 33.7 g/dL (ref 30.0–36.0)
MCV: 89 fL (ref 80.0–100.0)
Platelets: 303 10*3/uL (ref 150–400)
RBC: 3.37 MIL/uL — ABNORMAL LOW (ref 3.87–5.11)
RDW: 13.9 % (ref 11.5–15.5)
WBC: 17.5 10*3/uL — ABNORMAL HIGH (ref 4.0–10.5)
nRBC: 0 % (ref 0.0–0.2)

## 2021-01-26 LAB — TYPE AND SCREEN
ABO/RH(D): A POS
Antibody Screen: NEGATIVE

## 2021-01-26 MED ORDER — INFLUENZA VAC SPLIT QUAD 0.5 ML IM SUSY
0.5000 mL | PREFILLED_SYRINGE | INTRAMUSCULAR | Status: AC
  Administered 2021-01-27: 0.5 mL via INTRAMUSCULAR
  Filled 2021-01-26: qty 0.5

## 2021-01-26 NOTE — Lactation Note (Signed)
This note was copied from a baby's chart. Lactation Consultation Note  Patient Name: Felicia Woodard [id number] Date: 01/26/2021 Reason for consult: Initial assessment;NICU baby;Early term 37-38.6wks Age:29 hours  Lactation conducted an initial consult with Felicia Woodard and her daughter, Felicia Woodard. Felicia Woodard was in the room with baby and FOB at this time. Her RN has set up her DEBP, and she has pumped several times already. I provided cleaning supplies for pumping in the NICU, as patient plans to spend time on this floor (mom is still admitted). I reviewed how to correctly clean her pump equipment.  Felicia Woodard has experience breast feeding her now 30 year old. She has also pumped. She states that she is using size 27 flanges, and they are comfortable.   I reviewed recommendations for best practices in breast pumping and recommended that she pump every 2-3 hours during the day and every 3-4 hours at night. I educated on what to expect in terms of pumping volume in the first 1-3 days and encouraged her to continue pumping even if sessions might be perceived as nonproductive (if milk does not express). Felicia Woodard verbalized understanding.   Maternal Data Has patient been taught Hand Expression?: Yes (already knows how) Does the patient have breastfeeding experience prior to this delivery?: Yes How long did the patient breastfeed?: 6 months  Lactation Tools Discussed/Used Tools: Pump Pump Education: Setup, frequency, and cleaning Pumping frequency: rec 8 times a day; q2-4 hours Pumped volume: 5 mL (5 mls)  Interventions Interventions: Breast feeding basics reviewed;Education;DEBP  Discharge Pump: DEBP;Personal (Mom cannot recall the name of her insurance pump, but she has a new insurance pump at home.)  Consult Status Consult Status: Follow-up Date: 01/26/21 Follow-up type: In-patient    Walker Shadow 01/26/2021, 11:39 AM

## 2021-01-26 NOTE — Lactation Note (Signed)
This note was copied from a baby's chart. Lactation Consultation Note  Patient Name: Felicia Woodard [id number] Date: 01/26/2021 Reason for consult: Follow-up assessment;Mother's request;NICU baby;Early term 37-38.6wks Age:29 hours  1505 - Lactation called into the room to assist with breast feeding baby. Baby's feeding status changed to ad lib this afternoon, and [name] requested help with establishing baby's first latch. We placed [name] in cradle position on the left breast, and I assisted with hand expression and positioning. Baby did not show feeding cues at this time. Baby had medium emesis at the breast and several burps. We moved her to mother's chest, and she did not cue.  I advised breast feeding on demand 8-12 times a day. Mom has experience breast feeding but does need support with positioning baby due to various leads. I encouraged her to hand express colostrum and to allow baby to lick and nuzzle the breast to elicit a suck reflex. [name] verbalized understanding. I recommend that lactation follow up tomorrow.   Maternal Data Has patient been taught Hand Expression?: Yes Does the patient have breastfeeding experience prior to this delivery?: Yes  Feeding Mother's Current Feeding Choice: Breast Milk  LATCH Score Latch: Too sleepy or reluctant, no latch achieved, no sucking elicited.  Audible Swallowing: None  Type of Nipple: Everted at rest and after stimulation  Comfort (Breast/Nipple): Soft / non-tender  Hold (Positioning): Assistance needed to correctly position infant at breast and maintain latch.  LATCH Score: 5   Interventions Interventions: Breast feeding basics reviewed;Assisted with latch;Skin to skin;Hand express;Adjust position;Support pillows;Education  Discharge Pump: DEBP  Consult Status Consult Status: Follow-up Date: 01/26/21 Follow-up type: In-patient    Walker Shadow 01/26/2021, 3:56 PM

## 2021-01-26 NOTE — Anesthesia Postprocedure Evaluation (Signed)
Anesthesia Post Note  Patient: Felicia Woodard  Procedure(s) Performed: AN AD HOC LABOR EPIDURAL     Patient location during evaluation: Mother Baby Anesthesia Type: Epidural Level of consciousness: awake and alert Pain management: pain level controlled Vital Signs Assessment: post-procedure vital signs reviewed and stable Respiratory status: spontaneous breathing, nonlabored ventilation and respiratory function stable Cardiovascular status: stable Postop Assessment: no headache, no backache, epidural receding, no apparent nausea or vomiting, patient able to bend at knees, adequate PO intake and able to ambulate Anesthetic complications: no   No notable events documented.  Last Vitals:  Vitals:   01/26/21 0258 01/26/21 0642  BP: 104/64 106/76  Pulse: 70 64  Resp: 18 19  Temp: 36.7 C 36.6 C  SpO2: 97% 99%    Last Pain:  Vitals:   01/26/21 0642  TempSrc: Oral  PainSc: 7    Pain Goal:                   Laban Emperor

## 2021-01-26 NOTE — Progress Notes (Signed)
POSTPARTUM PROGRESS NOTE  Post Partum Day #1  Subjective:  No acute events overnight.  Pt denies problems with ambulating, voiding or po intake.  She denies nausea or vomiting.  Pain is well controlled.   Lochia Minimal.   Objective: Blood pressure 106/76, pulse 64, temperature 97.9 F (36.6 C), temperature source Oral, resp. rate 19, height 5\' 4"  (1.626 m), weight 87.8 kg, last menstrual period 05/09/2020, SpO2 99 %, unknown if currently breastfeeding.  Physical Exam:  General: alert, cooperative and no distress Lochia:normal flow Chest: CTAB Heart: RRR no m/r/g Abdomen: +BS, soft, nontender Uterine Fundus: firm, 2cm below umbilicus Extremities: neg edema, neg calf TTP BL, neg Homans BL  Recent Labs    01/25/21 0019 01/26/21 0454  HGB 10.8* 10.1*  HCT 32.9* 30.0*    Assessment/Plan:  ASSESSMENT: Felicia Woodard is a 30 y.o. 37 s/p SVD @ [redacted]w[redacted]d. PNC c/b know bilateral fetal hydrothorax with neg workup, h/o thrombophilia for PAI-1 4G/5G.   Plan for discharge tomorrow Baby in NICU, currently on CPAP, may trial room air this morning.   LOS: 1 day

## 2021-01-27 ENCOUNTER — Ambulatory Visit: Payer: Self-pay

## 2021-01-27 ENCOUNTER — Encounter (HOSPITAL_COMMUNITY): Payer: Self-pay | Admitting: *Deleted

## 2021-01-27 MED ORDER — IBUPROFEN 600 MG PO TABS: 600.0000 mg | ORAL_TABLET | Freq: Four times a day (QID) | ORAL | 0 refills | Status: DC

## 2021-01-27 NOTE — Lactation Note (Signed)
This note was copied from a baby's chart. Lactation Consultation Note  Patient Name: Felicia Woodard [id number] Date: 01/27/2021 Reason for consult: Follow-up assessment;NICU baby;Early term 37-38.6wks;Other (Comment) (materna discharge) Age:29 hours  Visited with mom of 40 hours old ETI NICU female, she's a P2 and experienced BF. Mom got discharged today from [facility]. Reviewed discharge education, engorgement prevention & treatment, pumping schedule, supply/demand and benefits of STS care.  Mom has been putting baby to breast and requested assistance during [name]. LC took baby to mother's left breast in cross cradle position (football was recommended on the R breast due to baby's IV) but baby would not wake up, she opened her mouth and "latched" but no sucking reflex elicited even when LC did hand expression with mom.   An attempt was documented, mom and baby doing STS when exiting the room.  Plan of care:  Encouraged mom to start pumping consistently every 2-3 hours, at least 8 pumping sessions/24 hours She'll apply coconut oil prior pumping She'll continue taking baby to breast STS on feeding cues. Once baby starts going to breast consistently pumping was recommended after feedings at the breast  FOB present. All questions and concerns answered, parents to call NICU LC PRN.  Maternal Data   Mom's supply is on the works and WNL  Feeding Mother's Current Feeding Choice: Breast Milk and Formula Nipple Type: Dr. Irving Burton Preemie  LATCH Score Latch: Grasps breast easily, tongue down, lips flanged, rhythmical sucking.  Audible Swallowing: A few with stimulation  Type of Nipple: Everted at rest and after stimulation  Comfort (Breast/Nipple): Soft / non-tender  Hold (Positioning): No assistance needed to correctly position infant at breast.  LATCH Score: 9   Lactation Tools Discussed/Used Tools: Pump Breast pump type: Double-Electric Breast Pump Pump  Education: Setup, frequency, and cleaning;Milk Storage Reason for Pumping: ETI in NICU Pumping frequency: q 3-4 hours Pumped volume:  (drops)  Interventions Interventions: Breast feeding basics reviewed;DEBP;Education;Assisted with latch;Skin to skin;Hand express;Support pillows;Adjust position;Breast compression  Discharge Discharge Education: Engorgement and breast care Pump: DEBP;Personal  Consult Status Consult Status: Follow-up Date: 01/27/21 Follow-up type: In-patient  Felicia Woodard Felicia Woodard 01/27/2021, 1:44 PM

## 2021-01-27 NOTE — Progress Notes (Signed)
PPD #2 Doing well, in NICU working on breastfeeding, baby doing ok Afeb, VSS D/c home

## 2021-01-27 NOTE — Progress Notes (Signed)
Patient discharged to home with husband.  Condition stable.  Patient ambulated to NICU with plans to leave hospital from there.  No equipment ordered for home at discharge.

## 2021-01-27 NOTE — Plan of Care (Signed)

## 2021-01-27 NOTE — Discharge Instructions (Signed)
As per discharge pamphlet °

## 2021-01-27 NOTE — Discharge Summary (Signed)
Postpartum Discharge Summary      Patient Name: Felicia Woodard DOB: Feb 16, 1991 MRN: [id number]  Date of admission: 01/25/2021 Delivery date:01/25/2021  Delivering provider: Waynard Reeds  Date of discharge: 01/27/2021  Admitting diagnosis: Fetal abnormality affecting management of mother, antepartum condition or complication [O35.9XX0] Intrauterine pregnancy: [redacted]w[redacted]d     Secondary diagnosis:  Active Problems:   Fetal abnormality affecting management of mother, antepartum condition or complication      Discharge diagnosis: Term Pregnancy Delivered and fetal hydrothorax                                                Hospital course: Induction of Labor With Vaginal Delivery   30 y.o. yo [id number] at [redacted]w[redacted]d was admitted to the hospital 01/25/2021 for induction of labor.  Indication for induction:  fetal hydrothorax .  Patient had an uncomplicated labor course as follows: Membrane Rupture Time/Date: 12:33 PM ,01/25/2021   Delivery Method:Vaginal, Spontaneous  Episiotomy: None  Lacerations:    Details of delivery can be found in separate delivery note.  Patient had a routine postpartum course. Patient is discharged home 01/27/21.  Newborn Data: Birth date:01/25/2021  Birth time:9:08 PM  Gender:Female  Living status:Living  Apgars:8 ,9  Weight:3490 g   Physical exam  Vitals:   01/26/21 2245 01/27/21 0504 01/27/21 0514 01/27/21 0822  BP: 101/68 (!) 95/57 99/65 (!) 102/59  Pulse: 69 60 (!) 59 65  Resp: 17 17  18   Temp: 97.8 F (36.6 C) 97.8 F (36.6 C)  98.6 F (37 C)  TempSrc: Oral Oral  Oral  SpO2: 98% 98%  99%  Weight:      Height:       General: alert Lochia: appropriate Uterine Fundus: firm  Labs: Lab Results  Component Value Date   WBC 17.5 (H) 01/26/2021   HGB 10.1 (L) 01/26/2021   HCT 30.0 (L) 01/26/2021   MCV 89.0 01/26/2021   PLT 303 01/26/2021   CMP Latest Ref Rng & Units 05/21/2018  Glucose 65 - 99 mg/dL 84   Edinburgh Score: Edinburgh Postnatal  Depression Scale Screening Tool 01/26/2021  I have been able to laugh and see the funny side of things. (No Data)      After visit meds:  Allergies as of 01/27/2021   No Known Allergies      Medication List     STOP taking these medications    metFORMIN 500 MG tablet Commonly known as: GLUCOPHAGE   UltiCare Insulin Syringe 30G X 5/16" 1 ML Misc Generic drug: Insulin Syringe-Needle U-100       TAKE these medications    albuterol 108 (90 Base) MCG/ACT inhaler Commonly known as: VENTOLIN HFA Inhale 2 puffs into the lungs every 6 (six) hours as needed for wheezing or shortness of breath.   ALBUTEROL IN Inhale into the lungs as needed.   aspirin EC 81 MG tablet Take 81 mg by mouth daily. Swallow whole.   ferrous sulfate 325 (65 FE) MG tablet Take 325 mg by mouth daily with breakfast.   ibuprofen 600 MG tablet Commonly known as: ADVIL Take 1 tablet (600 mg total) by mouth every 6 (six) hours.   prenatal multivitamin Tabs tablet Take 1 tablet by mouth daily at 12 noon.         Discharge home in stable condition Infant Feeding: Breast  Infant Disposition:NICU Discharge instruction: per After Visit Summary and Postpartum booklet. Activity: Advance as tolerated. Pelvic rest for 6 weeks.  Diet: routine diet Postpartum Appointment:6 weeks Follow up Visit:  Follow-up Information     Waynard Reeds, MD. Schedule an appointment as soon as possible for a visit in 6 week(s).   Specialty: Obstetrics and Gynecology Contact information: [address] [place] [address] [phone]                     01/27/2021 Zenaida Niece, MD

## 2021-01-28 ENCOUNTER — Ambulatory Visit: Payer: Self-pay

## 2021-01-28 NOTE — Lactation Note (Signed)
This note was copied from a baby's chart. Lactation Consultation Note  Patient Name: Felicia Woodard [id number] Date: 01/28/2021 Reason for consult: Follow-up assessment;NICU baby;Early term 37-38.6wks Age:29 hours  Visited with mom of 2 hours old ETI NICU female, she's a P2 and experienced BF. Baby is going home today, reviewed discharge education, mom will continue pumping after feedings at the breast to protect her supply.   She'll continue taking baby to breast on feeding cues 8-12 times/24 hours and will supplement with EBM/formula afterwards. Baby has been on Similac 20 calorie formula during NICU stay. FOB present, all questions and concerns answered, parents aware of NICU LC and will contact PRN.   Maternal Data   Mom's milk supply is starting to come in and is now WNL  Feeding Mother's Current Feeding Choice: Breast Milk and Formula  Lactation Tools Discussed/Used Tools: Pump;Flanges Flange Size: 24 Breast pump type: Double-Electric Breast Pump Pump Education: Setup, frequency, and cleaning Reason for Pumping: ETI in NICU Pumping frequency: 8 times/24 hours Pumped volume: 8 mL  Interventions Interventions: Breast feeding basics reviewed;DEBP;Education  Discharge Pump: DEBP;Personal  Consult Status Consult Status: Complete Date: 01/28/21 Follow-up type: Call as needed   Kadeen Sroka Venetia Constable 01/28/2021, 3:15 PM

## 2021-02-06 ENCOUNTER — Telehealth (HOSPITAL_COMMUNITY): Payer: Self-pay | Admitting: *Deleted

## 2021-02-06 NOTE — Telephone Encounter (Signed)
Patient voiced no questions or concerns regarding her own health. EPDS = 1. Patient reported infant was discharged from NICU on 10/1. Patient voiced no questions or concerns regarding baby at this time. Patient reported infant sleeps in a pack-N-play on her back. RN reviewed ABCs of safe sleep - patient verbalized understanding. Deforest Hoyles, RN, 02/06/21, [phone].

## 2021-06-15 ENCOUNTER — Other Ambulatory Visit (HOSPITAL_BASED_OUTPATIENT_CLINIC_OR_DEPARTMENT_OTHER): Payer: Self-pay

## 2022-01-16 ENCOUNTER — Encounter (HOSPITAL_BASED_OUTPATIENT_CLINIC_OR_DEPARTMENT_OTHER): Payer: Self-pay | Admitting: Obstetrics and Gynecology

## 2022-01-16 NOTE — Progress Notes (Signed)
Spoke w/ via phone for pre-op interview--- [employer]----   UPT CBC and T&S-per surgeon            Lab results------ COVID test -----patient states asymptomatic no test needed Arrive at -------0530 NPO after MN NO Solid Food.   Med rec completed Medications to take morning of surgery -----Bring Albuterol inhaler Diabetic medication ----- Patient instructed no nail polish to be worn day of surgery Patient instructed to bring photo id and insurance card day of surgery Patient aware to have Driver (ride ) / caregiver Husband Felicia Woodard   for 24 hours after surgery  Patient Special Instructions ----- Pre-Op special Istructions ----- Patient verbalized understanding of instructions that were given at this phone interview. Patient denies shortness of breath, chest pain, fever, cough at this phone interview.

## 2022-02-04 NOTE — H&P (Signed)
Felicia Woodard is an 31 y.o. female [phone]  with abnormal uterine bleeding and suspected polyp on ultrasound.   H/o PCOS with irregular menses, presented in August with complaint of new heavy and prolonged periods, needing to change pad every 2-3 hours and lasting for 6-14 days.  12/07/21 GYN US:6.93cm anteverted uterus with 8.74mm EMS, 52mm echogenic foci on anterior wall suspect polyp, ovaries PCO but otherwise normal    Patient's last menstrual period was 11/26/2021.    Past Medical History:  Diagnosis Date   Family history of adverse reaction to anesthesia    Father had a low grade fever after anesthesia. Patient has received sevoflurane without complication.   History of kidney stones    History of miscarriage    Mild asthma    inhaler  prn   PCOS (polycystic ovarian syndrome)     Past Surgical History:  Procedure Laterality Date   CYST REMOVAL HAND Left 2007   DILATION AND EVACUATION N/A 06/12/2019   Procedure: DILATATION AND EVACUATION;  Surgeon: Joseph Pierini, MD;  Location: [place];  Service: Gynecology;  Laterality: N/A;   WISDOM TOOTH EXTRACTION      Family History  Problem Relation Age of Onset   Colon cancer Mother    Hypertension Father    Ovarian cancer Maternal Aunt    Atrial fibrillation Paternal Aunt     Social History:  reports that she has never smoked. She has never used smokeless tobacco. She reports that she does not drink alcohol and does not use drugs.  Allergies: No Known Allergies  No medications prior to admission.    Review of Systems  Constitutional:  Negative for fever.  HENT:  Negative for sore throat.   Respiratory:  Negative for shortness of breath.   Cardiovascular:  Negative for chest pain.  Gastrointestinal:  Negative for abdominal pain.  Genitourinary:  Positive for menstrual problem. Negative for vaginal bleeding.  Musculoskeletal:  Negative for neck pain.  Skin:  Negative for rash.  Neurological:   Negative for headaches.  Psychiatric/Behavioral:  Negative for suicidal ideas.     Height 5\' 8"  (1.727 m), weight 81.6 kg, last menstrual period 11/26/2021, unknown if currently breastfeeding. Physical Exam  Constitutional General Appearance: healthy-appearing, well-nourished, well-developed  Head Head: normocephalic  Lungs Respiratory Effort: no accessory muscle usage, no intercostal retractions  Extremities Legs: normal Arms: normal  Skin Appearance: no obvious rashes, no obvious lesions  Neurological System Impressions motor: no deficits, sensory: no deficits  Psychiatric Orientation: to person, to time Mood and Affect: active and alert, normal mood, normal affect  No results found for this or any previous visit (from the past 24 hour(s)).  No results found.  Assessment/Plan: 31Y P2 with AUB, suspected polyp - Plan: hysteroscopic polypectomy, dilation and curettage - Informed consent obtained. Reviewed risks of infection, bleeding, uterine perforation, failure to achieve desired result. All questions answered.   Rowland Lathe 02/04/2022, 7:20 PM

## 2022-02-05 ENCOUNTER — Encounter (HOSPITAL_BASED_OUTPATIENT_CLINIC_OR_DEPARTMENT_OTHER)
Admission: RE | Disposition: A | Discharge status: Home / Self Care | Payer: Self-pay | Source: Home / Self Care | Attending: Obstetrics and Gynecology

## 2022-02-05 ENCOUNTER — Ambulatory Visit (HOSPITAL_BASED_OUTPATIENT_CLINIC_OR_DEPARTMENT_OTHER): Payer: Medicaid Other | Admitting: Certified Registered Nurse Anesthetist

## 2022-02-05 ENCOUNTER — Ambulatory Visit (HOSPITAL_BASED_OUTPATIENT_CLINIC_OR_DEPARTMENT_OTHER)
Admission: RE | Admit: 2022-02-05 | Discharge: 2022-02-05 | Disposition: A | Discharge status: Home / Self Care | Payer: Medicaid Other | Attending: Obstetrics and Gynecology | Admitting: Obstetrics and Gynecology

## 2022-02-05 ENCOUNTER — Other Ambulatory Visit: Payer: Self-pay

## 2022-02-05 ENCOUNTER — Encounter (HOSPITAL_BASED_OUTPATIENT_CLINIC_OR_DEPARTMENT_OTHER): Payer: Self-pay | Admitting: Obstetrics and Gynecology

## 2022-02-05 DIAGNOSIS — N939 Abnormal uterine and vaginal bleeding, unspecified: Secondary | ICD-10-CM | POA: Diagnosis present

## 2022-02-05 DIAGNOSIS — J45909 Unspecified asthma, uncomplicated: Secondary | ICD-10-CM | POA: Insufficient documentation

## 2022-02-05 DIAGNOSIS — Z01818 Encounter for other preprocedural examination: Secondary | ICD-10-CM

## 2022-02-05 DIAGNOSIS — N840 Polyp of corpus uteri: Secondary | ICD-10-CM

## 2022-02-05 HISTORY — PX: DILATATION & CURETTAGE/HYSTEROSCOPY WITH MYOSURE: SHX6511

## 2022-02-05 HISTORY — DX: Personal history of urinary calculi: Z87.442

## 2022-02-05 LAB — CBC
HCT: 41 % (ref 36.0–46.0)
Hemoglobin: 13.5 g/dL (ref 12.0–15.0)
MCH: 29.4 pg (ref 26.0–34.0)
MCHC: 32.9 g/dL (ref 30.0–36.0)
MCV: 89.3 fL (ref 80.0–100.0)
Platelets: 386 10*3/uL (ref 150–400)
RBC: 4.59 MIL/uL (ref 3.87–5.11)
RDW: 13.1 % (ref 11.5–15.5)
WBC: 8.9 10*3/uL (ref 4.0–10.5)
nRBC: 0 % (ref 0.0–0.2)

## 2022-02-05 LAB — TYPE AND SCREEN
ABO/RH(D): A POS
Antibody Screen: NEGATIVE

## 2022-02-05 LAB — POCT PREGNANCY, URINE: Preg Test, Ur: NEGATIVE

## 2022-02-05 SURGERY — DILATATION & CURETTAGE/HYSTEROSCOPY WITH MYOSURE
Anesthesia: General | Site: Perineum

## 2022-02-05 MED ORDER — DEXAMETHASONE SODIUM PHOSPHATE 10 MG/ML IJ SOLN
INTRAMUSCULAR | Status: AC
  Filled 2022-02-05: qty 1

## 2022-02-05 MED ORDER — ACETAMINOPHEN 500 MG PO TABS
ORAL_TABLET | ORAL | Status: AC
  Filled 2022-02-05: qty 2

## 2022-02-05 MED ORDER — DEXAMETHASONE SODIUM PHOSPHATE 10 MG/ML IJ SOLN
INTRAMUSCULAR | Status: DC | PRN
  Administered 2022-02-05: 10 mg via INTRAVENOUS

## 2022-02-05 MED ORDER — MEPERIDINE HCL 25 MG/ML IJ SOLN: 6.2500 mg | INTRAMUSCULAR | Status: DC | PRN

## 2022-02-05 MED ORDER — PROPOFOL 10 MG/ML IV BOLUS
INTRAVENOUS | Status: AC
  Filled 2022-02-05: qty 20

## 2022-02-05 MED ORDER — MIDAZOLAM HCL 5 MG/5ML IJ SOLN
INTRAMUSCULAR | Status: DC | PRN
  Administered 2022-02-05: 2 mg via INTRAVENOUS

## 2022-02-05 MED ORDER — ACETAMINOPHEN 160 MG/5ML PO SOLN: 325.0000 mg | ORAL | Status: DC | PRN

## 2022-02-05 MED ORDER — ONDANSETRON HCL 4 MG/2ML IJ SOLN: 4.0000 mg | Freq: Once | INTRAMUSCULAR | Status: DC | PRN

## 2022-02-05 MED ORDER — OXYCODONE HCL 5 MG/5ML PO SOLN: 5.0000 mg | Freq: Once | ORAL | Status: DC | PRN

## 2022-02-05 MED ORDER — IBUPROFEN 200 MG PO TABS: 600.0000 mg | ORAL_TABLET | Freq: Four times a day (QID) | ORAL | Status: DC | PRN

## 2022-02-05 MED ORDER — MIDAZOLAM HCL 2 MG/2ML IJ SOLN
INTRAMUSCULAR | Status: AC
  Filled 2022-02-05: qty 2

## 2022-02-05 MED ORDER — PROPOFOL 10 MG/ML IV BOLUS
INTRAVENOUS | Status: DC | PRN
  Administered 2022-02-05: 150 mg via INTRAVENOUS
  Administered 2022-02-05: 50 mg via INTRAVENOUS

## 2022-02-05 MED ORDER — FENTANYL CITRATE (PF) 100 MCG/2ML IJ SOLN
INTRAMUSCULAR | Status: AC
  Filled 2022-02-05: qty 2

## 2022-02-05 MED ORDER — SODIUM CHLORIDE 0.9 % IR SOLN
Status: DC | PRN
  Administered 2022-02-05: 3000 mL

## 2022-02-05 MED ORDER — OXYCODONE HCL 5 MG PO TABS: 5.0000 mg | ORAL_TABLET | Freq: Once | ORAL | Status: DC | PRN

## 2022-02-05 MED ORDER — LIDOCAINE HCL (PF) 2 % IJ SOLN
INTRAMUSCULAR | Status: AC
  Filled 2022-02-05: qty 5

## 2022-02-05 MED ORDER — ACETAMINOPHEN 500 MG PO TABS
1000.0000 mg | ORAL_TABLET | ORAL | Status: AC
  Administered 2022-02-05: 1000 mg via ORAL

## 2022-02-05 MED ORDER — KETOROLAC TROMETHAMINE 30 MG/ML IJ SOLN
INTRAMUSCULAR | Status: DC | PRN
  Administered 2022-02-05: 30 mg via INTRAVENOUS

## 2022-02-05 MED ORDER — LACTATED RINGERS IV SOLN: INTRAVENOUS | Status: DC

## 2022-02-05 MED ORDER — KETOROLAC TROMETHAMINE 30 MG/ML IJ SOLN
INTRAMUSCULAR | Status: AC
  Filled 2022-02-05: qty 1

## 2022-02-05 MED ORDER — ACETAMINOPHEN 325 MG PO TABS: 325.0000 mg | ORAL_TABLET | ORAL | Status: DC | PRN

## 2022-02-05 MED ORDER — ONDANSETRON HCL 4 MG/2ML IJ SOLN
INTRAMUSCULAR | Status: DC | PRN
  Administered 2022-02-05: 4 mg via INTRAVENOUS

## 2022-02-05 MED ORDER — FENTANYL CITRATE (PF) 100 MCG/2ML IJ SOLN
INTRAMUSCULAR | Status: DC | PRN
  Administered 2022-02-05: 100 ug via INTRAVENOUS

## 2022-02-05 MED ORDER — SILVER NITRATE-POT NITRATE 75-25 % EX MISC
CUTANEOUS | Status: DC | PRN
  Administered 2022-02-05: 1

## 2022-02-05 MED ORDER — KETOROLAC TROMETHAMINE 30 MG/ML IJ SOLN: 30.0000 mg | Freq: Once | INTRAMUSCULAR | Status: DC | PRN

## 2022-02-05 MED ORDER — LIDOCAINE HCL 1 % IJ SOLN
INTRAMUSCULAR | Status: DC | PRN
  Administered 2022-02-05: 10 mL

## 2022-02-05 MED ORDER — LIDOCAINE 2% (20 MG/ML) 5 ML SYRINGE
INTRAMUSCULAR | Status: DC | PRN
  Administered 2022-02-05: 100 mg via INTRAVENOUS

## 2022-02-05 MED ORDER — FENTANYL CITRATE (PF) 100 MCG/2ML IJ SOLN: 25.0000 ug | INTRAMUSCULAR | Status: DC | PRN

## 2022-02-05 MED ORDER — ONDANSETRON HCL 4 MG/2ML IJ SOLN
INTRAMUSCULAR | Status: AC
  Filled 2022-02-05: qty 2

## 2022-02-05 SURGICAL SUPPLY — 18 items
CATH ROBINSON RED A/P 16FR (CATHETERS) ×1 IMPLANT
DEVICE MYOSURE LITE (MISCELLANEOUS) IMPLANT
DEVICE MYOSURE REACH (MISCELLANEOUS) IMPLANT
DRSG TELFA 3X8 NADH STRL (GAUZE/BANDAGES/DRESSINGS) ×1 IMPLANT
GAUZE 4X4 16PLY ~~LOC~~+RFID DBL (SPONGE) ×1 IMPLANT
GLOVE BIO SURGEON STRL SZ 6 (GLOVE) ×1 IMPLANT
GLOVE BIOGEL PI IND STRL 6.5 (GLOVE) ×1 IMPLANT
GOWN STRL REUS W/ TWL LRG LVL3 (GOWN DISPOSABLE) ×1 IMPLANT
GOWN STRL REUS W/TWL LRG LVL3 (GOWN DISPOSABLE) ×1
IV NS IRRIG 3000ML ARTHROMATIC (IV SOLUTION) IMPLANT
KIT PROCEDURE FLUENT (KITS) ×1 IMPLANT
KIT TURNOVER CYSTO (KITS) ×1 IMPLANT
PACK VAGINAL MINOR WOMEN LF (CUSTOM PROCEDURE TRAY) ×1 IMPLANT
PAD OB MATERNITY 4.3X12.25 (PERSONAL CARE ITEMS) ×1 IMPLANT
SEAL ROD LENS SCOPE MYOSURE (ABLATOR) ×1 IMPLANT
SOL PREP POV-IOD 4OZ 10% (MISCELLANEOUS) IMPLANT
TOWEL OR 17X26 10 PK STRL BLUE (TOWEL DISPOSABLE) ×1 IMPLANT
UNDERPAD 30X36 HEAVY ABSORB (UNDERPADS AND DIAPERS) ×1 IMPLANT

## 2022-02-05 NOTE — Transfer of Care (Signed)
Immediate Anesthesia Transfer of Care Note  Patient: Felicia Woodard  Procedure(s) Performed: DILATATION & CURETTAGE/HYSTEROSCOPY, POLYPECTOMY WITH MYOSURE LITE (Perineum)  Patient Location: PACU  Anesthesia Type:General  Level of Consciousness: awake, alert , oriented and patient cooperative  Airway & Oxygen Therapy: Patient Spontanous Breathing  Post-op Assessment: Report given to RN and Post -op Vital signs reviewed and stable  Post vital signs: Reviewed and stable  Last Vitals:  Vitals Value Taken Time  BP 104/64 02/05/22 0805  Temp 36.5 C 02/05/22 0806  Pulse 72 02/05/22 0809  Resp 19 02/05/22 0809  SpO2 96 % 02/05/22 0809  Vitals shown include unvalidated device data.  Last Pain:  Vitals:   02/05/22 0558  TempSrc: Oral  PainSc: 0-No pain      Patients Stated Pain Goal: 6 (02/63/78 5885)  Complications: No notable events documented.

## 2022-02-05 NOTE — Anesthesia Procedure Notes (Signed)
Procedure Name: LMA Insertion Date/Time: 02/05/2022 7:33 AM  Performed by: Rogers Blocker, CRNAPre-anesthesia Checklist: Patient identified, Emergency Drugs available, Suction available and Patient being monitored Patient Re-evaluated:Patient Re-evaluated prior to induction Oxygen Delivery Method: Circle System Utilized Preoxygenation: Pre-oxygenation with 100% oxygen Induction Type: IV induction Ventilation: Mask ventilation without difficulty LMA: LMA inserted LMA Size: 4.0 Number of attempts: 1 Placement Confirmation: positive ETCO2 Tube secured with: Tape Dental Injury: Teeth and Oropharynx as per pre-operative assessment

## 2022-02-05 NOTE — Anesthesia Preprocedure Evaluation (Signed)
Anesthesia Evaluation  Patient identified by MRN, date of birth, ID band Patient awake    Reviewed: Allergy & Precautions, NPO status , Patient's Chart, lab work & pertinent test results  Airway Mallampati: I  TM Distance: >3 FB Neck ROM: Full    Dental no notable dental hx. (+) Dental Advisory Given, Teeth Intact   Pulmonary asthma ,    Pulmonary exam normal        Cardiovascular negative cardio ROS Normal cardiovascular exam     Neuro/Psych negative neurological ROS  negative psych ROS   GI/Hepatic negative GI ROS, Neg liver ROS,   Endo/Other  negative endocrine ROS  Renal/GU negative Renal ROS     Musculoskeletal negative musculoskeletal ROS (+)   Abdominal Normal abdominal exam  (+)   Peds  Hematology negative hematology ROS (+)   Anesthesia Other Findings   Reproductive/Obstetrics negative OB ROS                             Anesthesia Physical  Anesthesia Plan  ASA: II  Anesthesia Plan: General   Post-op Pain Management:    Induction: Intravenous  PONV Risk Score and Plan: 4 or greater and Ondansetron, Dexamethasone, Midazolam, Scopolamine patch - Pre-op and Treatment may vary due to age or medical condition  Airway Management Planned: LMA  Additional Equipment: None  Intra-op Plan:   Post-operative Plan: Extubation in OR  Informed Consent: I have reviewed the patients History and Physical, chart, labs and discussed the procedure including the risks, benefits and alternatives for the proposed anesthesia with the patient or authorized representative who has indicated his/her understanding and acceptance.     Dental advisory given  Plan Discussed with: CRNA  Anesthesia Plan Comments:         Anesthesia Quick Evaluation

## 2022-02-05 NOTE — Discharge Instructions (Signed)

## 2022-02-05 NOTE — Anesthesia Postprocedure Evaluation (Signed)
Anesthesia Post Note  Patient: Felicia Woodard  Procedure(s) Performed: DILATATION & CURETTAGE/HYSTEROSCOPY, POLYPECTOMY WITH MYOSURE LITE (Perineum)     Patient location during evaluation: PACU Anesthesia Type: General Level of consciousness: awake Pain management: pain level controlled Vital Signs Assessment: post-procedure vital signs reviewed and stable Respiratory status: spontaneous breathing Cardiovascular status: stable Postop Assessment: no apparent nausea or vomiting Anesthetic complications: no   No notable events documented.  Last Vitals:  Vitals:   02/05/22 0835 02/05/22 0854  BP:  104/61  Pulse: 86 63  Resp: 16 16  Temp: 36.5 C 36.5 C  SpO2: 97% 100%    Last Pain:  Vitals:   02/05/22 0854  TempSrc:   PainSc: 0-No pain                 Huston Foley

## 2022-02-05 NOTE — Op Note (Signed)
02/05/2022   7:56 AM   PATIENT:  Felicia Woodard  31 y.o. female   PRE-OPERATIVE DIAGNOSIS:  abnormal uterine bleeding, suspected endometrial polyp   POST-OPERATIVE DIAGNOSIS:   abnormal uterine bleeding, endometrial polyp   PROCEDURE:  Hysterosopic polypectomy, dilation and curettage   SURGEON:  Surgeon(s) and Role:    * Rowland Lathe, MD - Primary   ANESTHESIA:   local and general   EBL:  58ml    BLOOD ADMINISTERED:none   DRAINS: none    LOCAL MEDICATIONS USED:  LIDOCAINE  and Amount: 10 ml   SPECIMEN:  Source of Specimen:  endometrial polyp and curettings   DISPOSITION OF SPECIMEN:  PATHOLOGY   COUNTS:  YES   PLAN OF CARE: Discharge to home after PACU   PATIENT DISPOSITION:  PACU - hemodynamically stable.   COMPLICATIONS: none  FINDINGS: anteverted uterus sounded to 9cm. On hysterscopic view, bilateral ostia visualized. Endometrial polyp noted with stalk at mid anterior uterine fundus. Otherwise fluffy pink endometrium. Fluid deficit 237mL.   PROCEDURE IN DETAIL:  The patient was appropriately consented and taken to the operating room where anesthesia was administered without difficulty. Thromboguards were placed and connected. She was placed in the dorsal lithotomy position in stirrups. She was examined under anesthesia and found to have an anteverted uterus. The patient was then prepped and draped in normal sterile fashion. A speculum was inserted into the vagina. A single-tooth tenaculum was used to grasp the anterior lip of the cervix. A paracervical block was obtained by injecting a total of 39mL of 1% lidocaine at the 4 and 8 o'clock positions at the cervicovaginal junction. The uterus was sounded to 9cm. The cervical os was sequentially dilated using Pratt dilators to accommodate the hysteroscope. The hysteroscope was introduced under direct visualization and the uterus was distended with normal saline. Bilateral ostia were visualized. Findings as noted above.  The Myosure Lite device was used to resect the polyp. Specimen was collected for pathology. Hemostasis was noted in the uterine cavity. The hysteroscope was removed.  The tenaculum was removed from the cervix and hemostasis was obtained at the puncture sites using silver nitrate. The patient tolerated the procedure well. The instrument and sponge counts were correct times two. The patient was awakened from anesthesia and taken to the recovery room in stable condition.  Irene Pap, MD 02/05/22 7:59 AM

## 2022-02-05 NOTE — Brief Op Note (Signed)
02/05/2022  7:56 AM  PATIENT:  Felicia Woodard  31 y.o. female  PRE-OPERATIVE DIAGNOSIS:  abnormal uterine bleeding, suspected endometrial polyp  POST-OPERATIVE DIAGNOSIS:   abnormal uterine bleeding, endometrial polyp  PROCEDURE:  Hysterosopic polypectomy, dilation and curettage  SURGEON:  Surgeon(s) and Role:    * Rowland Lathe, MD - Primary  ANESTHESIA:   local and general  EBL:  15ml   BLOOD ADMINISTERED:none  DRAINS: none   LOCAL MEDICATIONS USED:  LIDOCAINE  and Amount: 10 ml  SPECIMEN:  Source of Specimen:  endometrial polyp and curettings  DISPOSITION OF SPECIMEN:  PATHOLOGY  COUNTS:  YES  TOURNIQUET:  * No tourniquets in log *  DICTATION: .Note written in EPIC  PLAN OF CARE: Discharge to home after PACU  PATIENT DISPOSITION:  PACU - hemodynamically stable.   Delay start of Pharmacological VTE agent (>24hrs) due to surgical blood loss or risk of bleeding: not applicable

## 2022-02-06 LAB — SURGICAL PATHOLOGY

## 2022-02-07 ENCOUNTER — Encounter (HOSPITAL_BASED_OUTPATIENT_CLINIC_OR_DEPARTMENT_OTHER): Payer: Self-pay | Admitting: Obstetrics and Gynecology

## 2022-05-07 NOTE — L&D Delivery Note (Signed)
Delivery Note Felicia Woodard is a [id number] at [redacted]w[redacted]d who had a spontaneous delivery at 1738 a viable female Felicia Woodard") was delivered via ROA.  APGAR: 9, 9; weight 3750g (8lb 4.3oz) .     Admitted for 41 week induction of labor. Induced with cytotec, pitocin, AROM. Progressed normally. Received epidural for pain management. Pushed for 5 minutes. Baby was delivered without difficulty. Tight nuchal cord x 1.  Delayed cord clamping for 60 seconds.  Delivery of placenta was spontaneous. Placenta was found to be intact, 3 -vessel cord was noted. The fundus was found to be firm. Superficial perineal abrasion hemostatic after applying pressure. Estimated blood loss 107cc. Instrument and gauze counts were correct at the end of the procedure.  Placenta status: to L&D   Anesthesia:  epidural Episiotomy:  none Lacerations:  superficial perineal abrasion, no repair required Suture Repair: n/a Est. Blood Loss (mL):  107  Mom to postpartum.  Baby to Couplet care / Skin to Skin.  Desires circumcision for baby boy.   Charlett Nose 01/09/2023, 7:05 PM

## 2022-05-18 LAB — OB RESULTS CONSOLE GC/CHLAMYDIA
Chlamydia: NEGATIVE
Neisseria Gonorrhea: NEGATIVE

## 2022-05-22 ENCOUNTER — Other Ambulatory Visit: Payer: Self-pay | Admitting: Obstetrics and Gynecology

## 2022-05-22 DIAGNOSIS — Z363 Encounter for antenatal screening for malformations: Secondary | ICD-10-CM

## 2022-05-22 DIAGNOSIS — Z8759 Personal history of other complications of pregnancy, childbirth and the puerperium: Secondary | ICD-10-CM

## 2022-06-07 LAB — OB RESULTS CONSOLE ABO/RH: RH Type: POSITIVE

## 2022-06-07 LAB — OB RESULTS CONSOLE RPR: RPR: NONREACTIVE

## 2022-06-07 LAB — OB RESULTS CONSOLE HIV ANTIBODY (ROUTINE TESTING): HIV: NONREACTIVE

## 2022-06-07 LAB — HEPATITIS C ANTIBODY: HCV Ab: NEGATIVE

## 2022-06-07 LAB — OB RESULTS CONSOLE ANTIBODY SCREEN: Antibody Screen: NEGATIVE

## 2022-06-07 LAB — OB RESULTS CONSOLE RUBELLA ANTIBODY, IGM: Rubella: IMMUNE

## 2022-06-07 LAB — OB RESULTS CONSOLE HEPATITIS B SURFACE ANTIGEN: Hepatitis B Surface Ag: NEGATIVE

## 2022-06-13 ENCOUNTER — Other Ambulatory Visit: Payer: Self-pay

## 2022-07-30 ENCOUNTER — Encounter: Payer: Self-pay | Admitting: *Deleted

## 2022-08-06 ENCOUNTER — Ambulatory Visit: Payer: Medicaid Other | Admitting: *Deleted

## 2022-08-06 ENCOUNTER — Ambulatory Visit: Payer: Medicaid Other | Attending: Obstetrics | Admitting: Obstetrics

## 2022-08-06 ENCOUNTER — Encounter: Payer: Self-pay | Admitting: *Deleted

## 2022-08-06 ENCOUNTER — Other Ambulatory Visit: Payer: Self-pay | Admitting: *Deleted

## 2022-08-06 ENCOUNTER — Ambulatory Visit: Payer: Medicaid Other | Attending: Obstetrics and Gynecology

## 2022-08-06 VITALS — BP 107/67 | HR 76

## 2022-08-06 DIAGNOSIS — Z8759 Personal history of other complications of pregnancy, childbirth and the puerperium: Secondary | ICD-10-CM | POA: Diagnosis not present

## 2022-08-06 DIAGNOSIS — Z362 Encounter for other antenatal screening follow-up: Secondary | ICD-10-CM

## 2022-08-06 DIAGNOSIS — E282 Polycystic ovarian syndrome: Secondary | ICD-10-CM | POA: Diagnosis present

## 2022-08-06 DIAGNOSIS — Z363 Encounter for antenatal screening for malformations: Secondary | ICD-10-CM | POA: Diagnosis not present

## 2022-08-06 DIAGNOSIS — Z3A18 18 weeks gestation of pregnancy: Secondary | ICD-10-CM

## 2022-08-06 DIAGNOSIS — O358XX0 Maternal care for other (suspected) fetal abnormality and damage, not applicable or unspecified: Secondary | ICD-10-CM | POA: Diagnosis not present

## 2022-08-06 NOTE — Progress Notes (Signed)
MFM Note  Felicia Woodard was seen for a detailed fetal anatomy scan as her prior pregnancy was complicated by a fetus with hydrothorax.  She reports that the hydrothorax resolved after thoracentesis was performed in the NICU.  That child is doing well today.  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  She was treated with prophylactic Lovenox in her prior pregnancy.  She has decided not to use prophylactic Lovenox in her current pregnancy.  She denies any problems in her current pregnancy.    She had a cell free DNA test earlier in her pregnancy which indicated a low risk for trisomy 24, 47, and 13. A female fetus is predicted.   She was informed that the fetal growth and amniotic fluid level were appropriate for her gestational age.   There were no obvious fetal anomalies noted on today's ultrasound exam.  There were no signs of fetal hydrothorax noted on today's exam.  The patient was informed that anomalies may be missed due to technical limitations. If the fetus is in a suboptimal position or maternal habitus is increased, visualization of the fetus in the maternal uterus may be impaired.  Due to a prior child with hydrothorax, we will continue to follow her with growth ultrasounds throughout her pregnancy.    A follow-up exam was scheduled in 5 weeks.    The patient and her partner stated that all of their questions were answered today.  A total of 20 minutes was spent counseling and coordinating the care for this patient.  Greater than 50% of the time was spent in direct face-to-face contact.

## 2022-09-03 ENCOUNTER — Ambulatory Visit: Payer: Medicaid Other

## 2022-09-06 ENCOUNTER — Encounter: Payer: Self-pay | Admitting: *Deleted

## 2022-09-10 ENCOUNTER — Ambulatory Visit: Payer: Medicaid Other | Attending: Obstetrics | Admitting: *Deleted

## 2022-09-10 ENCOUNTER — Ambulatory Visit (HOSPITAL_BASED_OUTPATIENT_CLINIC_OR_DEPARTMENT_OTHER): Payer: Medicaid Other

## 2022-09-10 ENCOUNTER — Encounter: Payer: Self-pay | Admitting: *Deleted

## 2022-09-10 VITALS — BP 113/67 | HR 66

## 2022-09-10 DIAGNOSIS — Z3A23 23 weeks gestation of pregnancy: Secondary | ICD-10-CM | POA: Insufficient documentation

## 2022-09-10 DIAGNOSIS — J45909 Unspecified asthma, uncomplicated: Secondary | ICD-10-CM

## 2022-09-10 DIAGNOSIS — Z1589 Genetic susceptibility to other disease: Secondary | ICD-10-CM

## 2022-09-10 DIAGNOSIS — Z362 Encounter for other antenatal screening follow-up: Secondary | ICD-10-CM | POA: Diagnosis present

## 2022-09-10 DIAGNOSIS — O99512 Diseases of the respiratory system complicating pregnancy, second trimester: Secondary | ICD-10-CM

## 2022-09-10 DIAGNOSIS — O358XX0 Maternal care for other (suspected) fetal abnormality and damage, not applicable or unspecified: Secondary | ICD-10-CM | POA: Diagnosis not present

## 2022-09-10 DIAGNOSIS — O09292 Supervision of pregnancy with other poor reproductive or obstetric history, second trimester: Secondary | ICD-10-CM | POA: Insufficient documentation

## 2022-09-10 DIAGNOSIS — E282 Polycystic ovarian syndrome: Secondary | ICD-10-CM

## 2022-09-11 ENCOUNTER — Other Ambulatory Visit: Payer: Self-pay | Admitting: *Deleted

## 2022-09-11 DIAGNOSIS — O289 Unspecified abnormal findings on antenatal screening of mother: Secondary | ICD-10-CM

## 2022-10-16 ENCOUNTER — Ambulatory Visit: Payer: Medicaid Other | Attending: Maternal & Fetal Medicine

## 2022-10-16 DIAGNOSIS — Z3A28 28 weeks gestation of pregnancy: Secondary | ICD-10-CM

## 2022-10-16 DIAGNOSIS — O289 Unspecified abnormal findings on antenatal screening of mother: Secondary | ICD-10-CM | POA: Diagnosis not present

## 2022-10-16 DIAGNOSIS — J45909 Unspecified asthma, uncomplicated: Secondary | ICD-10-CM | POA: Diagnosis not present

## 2022-10-16 DIAGNOSIS — O99513 Diseases of the respiratory system complicating pregnancy, third trimester: Secondary | ICD-10-CM | POA: Diagnosis not present

## 2022-10-16 DIAGNOSIS — O09293 Supervision of pregnancy with other poor reproductive or obstetric history, third trimester: Secondary | ICD-10-CM

## 2022-10-16 DIAGNOSIS — O358XX0 Maternal care for other (suspected) fetal abnormality and damage, not applicable or unspecified: Secondary | ICD-10-CM | POA: Diagnosis not present

## 2022-10-18 ENCOUNTER — Other Ambulatory Visit: Payer: Self-pay | Admitting: *Deleted

## 2022-10-18 DIAGNOSIS — Z362 Encounter for other antenatal screening follow-up: Secondary | ICD-10-CM

## 2022-11-27 ENCOUNTER — Ambulatory Visit: Payer: Medicaid Other

## 2022-11-27 ENCOUNTER — Encounter: Payer: Self-pay | Admitting: *Deleted

## 2022-11-27 ENCOUNTER — Ambulatory Visit: Payer: Medicaid Other | Attending: Obstetrics

## 2022-11-27 DIAGNOSIS — O3519X0 Maternal care for (suspected) chromosomal abnormality in fetus, other chromosomal abnormality, not applicable or unspecified: Secondary | ICD-10-CM

## 2022-11-27 DIAGNOSIS — J45909 Unspecified asthma, uncomplicated: Secondary | ICD-10-CM

## 2022-11-27 DIAGNOSIS — O99513 Diseases of the respiratory system complicating pregnancy, third trimester: Secondary | ICD-10-CM

## 2022-11-27 DIAGNOSIS — Z362 Encounter for other antenatal screening follow-up: Secondary | ICD-10-CM | POA: Insufficient documentation

## 2022-11-27 DIAGNOSIS — O09293 Supervision of pregnancy with other poor reproductive or obstetric history, third trimester: Secondary | ICD-10-CM | POA: Diagnosis not present

## 2022-11-27 DIAGNOSIS — Z3A34 34 weeks gestation of pregnancy: Secondary | ICD-10-CM

## 2022-12-11 LAB — OB RESULTS CONSOLE GBS: GBS: NEGATIVE

## 2023-01-02 ENCOUNTER — Telehealth (HOSPITAL_COMMUNITY): Payer: Self-pay | Admitting: *Deleted

## 2023-01-02 NOTE — Telephone Encounter (Signed)
Preadmission screen  

## 2023-01-03 ENCOUNTER — Encounter (HOSPITAL_COMMUNITY): Payer: Self-pay | Admitting: *Deleted

## 2023-01-09 ENCOUNTER — Inpatient Hospital Stay (HOSPITAL_COMMUNITY)
Admission: AD | Admit: 2023-01-09 | Discharge: 2023-01-10 | DRG: 806 | Disposition: A | Discharge status: Home / Self Care | Payer: Medicaid Other | Attending: Obstetrics and Gynecology | Admitting: Obstetrics and Gynecology

## 2023-01-09 ENCOUNTER — Inpatient Hospital Stay (HOSPITAL_COMMUNITY): Payer: Medicaid Other

## 2023-01-09 ENCOUNTER — Inpatient Hospital Stay (HOSPITAL_COMMUNITY): Payer: Medicaid Other | Admitting: Anesthesiology

## 2023-01-09 ENCOUNTER — Encounter (HOSPITAL_COMMUNITY): Payer: Self-pay | Admitting: Obstetrics and Gynecology

## 2023-01-09 DIAGNOSIS — Z87442 Personal history of urinary calculi: Secondary | ICD-10-CM | POA: Diagnosis not present

## 2023-01-09 DIAGNOSIS — O26893 Other specified pregnancy related conditions, third trimester: Secondary | ICD-10-CM | POA: Diagnosis present

## 2023-01-09 DIAGNOSIS — D6859 Other primary thrombophilia: Secondary | ICD-10-CM | POA: Diagnosis present

## 2023-01-09 DIAGNOSIS — Z3A41 41 weeks gestation of pregnancy: Secondary | ICD-10-CM | POA: Diagnosis present

## 2023-01-09 DIAGNOSIS — O480 Post-term pregnancy: Secondary | ICD-10-CM | POA: Diagnosis present

## 2023-01-09 DIAGNOSIS — Z8249 Family history of ischemic heart disease and other diseases of the circulatory system: Secondary | ICD-10-CM

## 2023-01-09 DIAGNOSIS — O9912 Other diseases of the blood and blood-forming organs and certain disorders involving the immune mechanism complicating childbirth: Secondary | ICD-10-CM | POA: Diagnosis present

## 2023-01-09 LAB — CBC
HCT: 33 % — ABNORMAL LOW (ref 36.0–46.0)
Hemoglobin: 11.1 g/dL — ABNORMAL LOW (ref 12.0–15.0)
MCH: 29 pg (ref 26.0–34.0)
MCHC: 33.6 g/dL (ref 30.0–36.0)
MCV: 86.2 fL (ref 80.0–100.0)
Platelets: 344 10*3/uL (ref 150–400)
RBC: 3.83 MIL/uL — ABNORMAL LOW (ref 3.87–5.11)
RDW: 13.9 % (ref 11.5–15.5)
WBC: 12.4 10*3/uL — ABNORMAL HIGH (ref 4.0–10.5)
nRBC: 0 % (ref 0.0–0.2)

## 2023-01-09 LAB — RPR: RPR Ser Ql: NONREACTIVE

## 2023-01-09 MED ORDER — EPHEDRINE 5 MG/ML INJ: 10.0000 mg | INTRAVENOUS | Status: DC | PRN

## 2023-01-09 MED ORDER — ONDANSETRON HCL 4 MG/2ML IJ SOLN: 4.0000 mg | Freq: Four times a day (QID) | INTRAMUSCULAR | Status: DC | PRN

## 2023-01-09 MED ORDER — OXYTOCIN-SODIUM CHLORIDE 30-0.9 UT/500ML-% IV SOLN
2.5000 [IU]/h | INTRAVENOUS | Status: DC
  Filled 2023-01-09: qty 500

## 2023-01-09 MED ORDER — PHENYLEPHRINE 80 MCG/ML (10ML) SYRINGE FOR IV PUSH (FOR BLOOD PRESSURE SUPPORT)
80.0000 ug | PREFILLED_SYRINGE | INTRAVENOUS | Status: DC | PRN
  Filled 2023-01-09: qty 10

## 2023-01-09 MED ORDER — TERBUTALINE SULFATE 1 MG/ML IJ SOLN: 0.2500 mg | Freq: Once | INTRAMUSCULAR | Status: DC | PRN

## 2023-01-09 MED ORDER — PHENYLEPHRINE 80 MCG/ML (10ML) SYRINGE FOR IV PUSH (FOR BLOOD PRESSURE SUPPORT)
80.0000 ug | PREFILLED_SYRINGE | INTRAVENOUS | Status: DC | PRN
  Administered 2023-01-09: 80 ug via INTRAVENOUS

## 2023-01-09 MED ORDER — OXYTOCIN BOLUS FROM INFUSION
333.0000 mL | Freq: Once | INTRAVENOUS | Status: AC
  Administered 2023-01-09: 333 mL via INTRAVENOUS

## 2023-01-09 MED ORDER — ACETAMINOPHEN 325 MG PO TABS
650.0000 mg | ORAL_TABLET | ORAL | Status: DC | PRN
  Administered 2023-01-10 (×3): 650 mg via ORAL
  Filled 2023-01-09 (×3): qty 2

## 2023-01-09 MED ORDER — IBUPROFEN 600 MG PO TABS
600.0000 mg | ORAL_TABLET | Freq: Four times a day (QID) | ORAL | Status: DC
  Administered 2023-01-09 – 2023-01-10 (×4): 600 mg via ORAL
  Filled 2023-01-09 (×4): qty 1

## 2023-01-09 MED ORDER — BENZOCAINE-MENTHOL 20-0.5 % EX AERO
1.0000 | INHALATION_SPRAY | CUTANEOUS | Status: DC | PRN
  Administered 2023-01-09: 1 via TOPICAL
  Filled 2023-01-09: qty 56

## 2023-01-09 MED ORDER — ONDANSETRON HCL 4 MG PO TABS: 4.0000 mg | ORAL_TABLET | ORAL | Status: DC | PRN

## 2023-01-09 MED ORDER — OXYCODONE-ACETAMINOPHEN 5-325 MG PO TABS: 1.0000 | ORAL_TABLET | ORAL | Status: DC | PRN

## 2023-01-09 MED ORDER — ALBUTEROL SULFATE (2.5 MG/3ML) 0.083% IN NEBU: 2.5000 mg | INHALATION_SOLUTION | Freq: Four times a day (QID) | RESPIRATORY_TRACT | Status: DC | PRN

## 2023-01-09 MED ORDER — FENTANYL CITRATE (PF) 100 MCG/2ML IJ SOLN: 50.0000 ug | INTRAMUSCULAR | Status: DC | PRN

## 2023-01-09 MED ORDER — ONDANSETRON HCL 4 MG/2ML IJ SOLN: 4.0000 mg | INTRAMUSCULAR | Status: DC | PRN

## 2023-01-09 MED ORDER — BISACODYL 10 MG RE SUPP: 10.0000 mg | Freq: Every day | RECTAL | Status: DC | PRN

## 2023-01-09 MED ORDER — ACETAMINOPHEN 325 MG PO TABS: 650.0000 mg | ORAL_TABLET | ORAL | Status: DC | PRN

## 2023-01-09 MED ORDER — SOD CITRATE-CITRIC ACID 500-334 MG/5ML PO SOLN: 30.0000 mL | ORAL | Status: DC | PRN

## 2023-01-09 MED ORDER — FLEET ENEMA RE ENEM: 1.0000 | ENEMA | Freq: Every day | RECTAL | Status: DC | PRN

## 2023-01-09 MED ORDER — OXYCODONE-ACETAMINOPHEN 5-325 MG PO TABS: 2.0000 | ORAL_TABLET | ORAL | Status: DC | PRN

## 2023-01-09 MED ORDER — OXYTOCIN-SODIUM CHLORIDE 30-0.9 UT/500ML-% IV SOLN
1.0000 m[IU]/min | INTRAVENOUS | Status: DC
  Administered 2023-01-09: 2 m[IU]/min via INTRAVENOUS

## 2023-01-09 MED ORDER — DIPHENHYDRAMINE HCL 50 MG/ML IJ SOLN: 12.5000 mg | INTRAMUSCULAR | Status: DC | PRN

## 2023-01-09 MED ORDER — OXYCODONE HCL 5 MG PO TABS: 5.0000 mg | ORAL_TABLET | ORAL | Status: DC | PRN

## 2023-01-09 MED ORDER — COCONUT OIL OIL
1.0000 | TOPICAL_OIL | Status: DC | PRN
  Administered 2023-01-10: 1 via TOPICAL

## 2023-01-09 MED ORDER — FENTANYL-BUPIVACAINE-NACL 0.5-0.125-0.9 MG/250ML-% EP SOLN
12.0000 mL/h | EPIDURAL | Status: DC | PRN
  Administered 2023-01-09: 12 mL/h via EPIDURAL
  Filled 2023-01-09: qty 250

## 2023-01-09 MED ORDER — SIMETHICONE 80 MG PO CHEW: 80.0000 mg | CHEWABLE_TABLET | ORAL | Status: DC | PRN

## 2023-01-09 MED ORDER — LACTATED RINGERS IV SOLN
500.0000 mL | Freq: Once | INTRAVENOUS | Status: AC
  Administered 2023-01-09: 500 mL via INTRAVENOUS

## 2023-01-09 MED ORDER — LACTATED RINGERS IV SOLN
500.0000 mL | INTRAVENOUS | Status: DC | PRN
  Administered 2023-01-09: 500 mL via INTRAVENOUS

## 2023-01-09 MED ORDER — TETANUS-DIPHTH-ACELL PERTUSSIS 5-2.5-18.5 LF-MCG/0.5 IM SUSY: 0.5000 mL | PREFILLED_SYRINGE | Freq: Once | INTRAMUSCULAR | Status: DC

## 2023-01-09 MED ORDER — MISOPROSTOL 25 MCG QUARTER TABLET
25.0000 ug | ORAL_TABLET | ORAL | Status: DC | PRN
  Administered 2023-01-09: 25 ug via VAGINAL
  Filled 2023-01-09: qty 1

## 2023-01-09 MED ORDER — PRENATAL MULTIVITAMIN CH
1.0000 | ORAL_TABLET | Freq: Every day | ORAL | Status: DC
  Administered 2023-01-10: 1 via ORAL
  Filled 2023-01-09: qty 1

## 2023-01-09 MED ORDER — DIPHENHYDRAMINE HCL 25 MG PO CAPS: 25.0000 mg | ORAL_CAPSULE | Freq: Four times a day (QID) | ORAL | Status: DC | PRN

## 2023-01-09 MED ORDER — DIBUCAINE (PERIANAL) 1 % EX OINT: 1.0000 | TOPICAL_OINTMENT | CUTANEOUS | Status: DC | PRN

## 2023-01-09 MED ORDER — LIDOCAINE HCL (PF) 1 % IJ SOLN
30.0000 mL | INTRAMUSCULAR | Status: DC | PRN
  Filled 2023-01-09: qty 30

## 2023-01-09 MED ORDER — LIDOCAINE HCL (PF) 1 % IJ SOLN
INTRAMUSCULAR | Status: DC | PRN
  Administered 2023-01-09 (×2): 4 mL via EPIDURAL

## 2023-01-09 MED ORDER — WITCH HAZEL-GLYCERIN EX PADS
1.0000 | MEDICATED_PAD | CUTANEOUS | Status: DC | PRN
  Administered 2023-01-09: 1 via TOPICAL

## 2023-01-09 MED ORDER — OXYCODONE HCL 5 MG PO TABS: 10.0000 mg | ORAL_TABLET | ORAL | Status: DC | PRN

## 2023-01-09 MED ORDER — LACTATED RINGERS IV SOLN: INTRAVENOUS | Status: DC

## 2023-01-09 MED ORDER — DOCUSATE SODIUM 100 MG PO CAPS
100.0000 mg | ORAL_CAPSULE | Freq: Two times a day (BID) | ORAL | Status: DC
  Administered 2023-01-10: 100 mg via ORAL
  Filled 2023-01-09: qty 1

## 2023-01-09 NOTE — H&P (Signed)
BRESLYNN BURDETTE is a 32 y.o. female [phone] [redacted]w[redacted]d presenting for scheduled late term induction. She reports no LOF, VB, Contractions. Normal FM.   Pregnancy c/b: PCOS: conceived with [facility] on Femara and Metformin (both stopped at 12 weeks) Thrombophilia: PAI1 4G/5G, was on Lovenox during last pregnancy, elected not to do Lovenox this pregnancy History of fetal anomaly: 2nd child diagnosed with hydrothorax prenatally, drained postnatally and has had no residual issues. She has been followed by MFM with scans this pregnancy and no issues noted.   OB History     Gravida  5   Para  2   Term  2   Preterm      AB  2   Living  2      SAB  2   IAB      Ectopic  0   Multiple  0   Live Births  2          Past Medical History:  Diagnosis Date   Amenorrhea 01/20/2016   Family history of adverse reaction to anesthesia    Father had a low grade fever after anesthesia. Patient has received sevoflurane without complication.   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   Scoliosis    Past Surgical History:  Procedure Laterality Date   CYST REMOVAL HAND Left 2007   DILATATION & CURETTAGE/HYSTEROSCOPY WITH MYOSURE N/A 02/05/2022   Procedure: DILATATION & CURETTAGE/HYSTEROSCOPY, POLYPECTOMY WITH MYOSURE;  Surgeon: Charlett Nose, MD;  Location: [facility] [place];  Service: Gynecology;  Laterality: N/A;  device needed = Myosure Lite   DILATION AND EVACUATION N/A 06/12/2019   Procedure: DILATATION AND EVACUATION;  Surgeon: [name], MD;  Location: [facility];  Service: Gynecology;  Laterality: N/A;   POLYPECTOMY     uterine   WISDOM TOOTH EXTRACTION     Family History: family history includes Atrial fibrillation in her paternal aunt;  Colon cancer in her mother; Hypertension in her father; Ovarian cancer in her maternal aunt. Social History:  reports that she has never smoked. She has never used smokeless tobacco. She reports that she does not drink alcohol and does not use drugs.     Maternal Diabetes: No Genetic Screening: Normal Maternal Ultrasounds/Referrals: Normal Fetal Ultrasounds or other Referrals:  Referred to Materal Fetal Medicine  Maternal Substance Abuse:  No Significant Maternal Medications:  None Significant Maternal Lab Results:  Group B Strep negative Other Comments:  None  Review of Systems Per HPI Exam Physical Exam  Dilation: 1 Effacement (%): Thick Exam by:: Dr. Timothy Lasso Blood pressure 110/76, pulse 84, temperature 98 F (36.7 C), temperature source Oral, resp. rate 16, height 5\' 8"  (1.727 m), weight 91.6 kg, last menstrual period 03/28/2022, unknown if currently breastfeeding. Gen: NAD, resting comfortably CVS: normal pulses Lungs: Nonlabored respirations Abd: Gravid abdomen Ext: no calf edema or tenderness Fetal testing: 135bpm, mod variability, + accels, no decels Toco: contractions q 2-5 mins Prenatal labs: ABO, Rh:  --/--/PENDING (09/04 0730) Antibody: PENDING (09/04 0730) Rubella: Immune (02/01 0000) RPR: Nonreactive (02/01 0000)  HBsAg: Negative (02/01 0000)  HIV: Non-reactive (02/01 0000)  GBS: Negative/-- (08/06 0000)   Assessment/Plan: 32Y [id number] @ [redacted]w[redacted]d, late term IOL Fetal wellbeing: cat I tracing IOL: first dose of  vaginal cytotec at 0745, continue q 4 hrs PRN. Pelvis proven to 8lb 4oz. Last growth scan at [redacted]w[redacted]d showed EFW 80% (5lb15oz) with AC 91%. Leopolds 8.5lbs. Will monitor labor curve closely and plan shoulder precautions at delivery. Pain control: epidural upon patient request  Charlett Nose 01/09/2023, 10:16 AM

## 2023-01-09 NOTE — Anesthesia Procedure Notes (Signed)
Epidural Patient location during procedure: OB Start time: 01/09/2023 1:59 PM End time: 01/09/2023 2:04 PM  Staffing Anesthesiologist: Linton Rump, MD Performed: anesthesiologist   Preanesthetic Checklist Completed: patient identified, IV checked, site marked, risks and benefits discussed, surgical consent, monitors and equipment checked, pre-op evaluation and timeout performed  Epidural Patient position: sitting Prep: DuraPrep and site prepped and draped Patient monitoring: continuous pulse ox and blood pressure Approach: midline Location: L3-L4 Injection technique: LOR saline  Needle:  Needle type: Tuohy  Needle gauge: 17 G Needle length: 9 cm and 9 Needle insertion depth: 6 cm Catheter type: closed end flexible Catheter size: 19 Gauge Catheter at skin depth: 10 cm Test dose: negative  Assessment Events: blood not aspirated, no cerebrospinal fluid, injection not painful, no injection resistance, no paresthesia and negative IV test  Additional Notes The patient has requested an epidural for labor pain management. Risks and benefits including, but not limited to, infection, bleeding, local anesthetic toxicity, headache, hypotension, back pain, block failure, etc. were discussed with the patient. The patient expressed understanding and consented to the procedure. I confirmed that the patient has no bleeding disorders and is not taking blood thinners. I confirmed the patient's last platelet count with the nurse. A time-out was performed immediately prior to the procedure. Please see nursing documentation for vital signs. Sterile technique was used throughout the whole procedure. Once LOR achieved, the epidural catheter threaded easily without resistance. Aspiration of the catheter was negative for blood and CSF. The epidural was dosed slowly and an infusion was started.  1 attempt(s)Reason for block:procedure for pain

## 2023-01-09 NOTE — Progress Notes (Signed)
OB Progress Note  S: rates contractions pain as 4/10   O: Today's Vitals   01/09/23 0658 01/09/23 0739 01/09/23 0740 01/09/23 0948  BP:  110/76    Pulse:  84    Resp:  16    Temp:  98 F (36.7 C)    TempSrc:  Oral    Weight: 91.6 kg     Height: 5\' 8"  (1.727 m)     PainSc:   0-No pain 3    Body mass index is 30.71 kg/m.  SVE 2/50/-3  FHR: 135bpm, mod variability, + accels, no decels Toco: ctx q 2-4 mins   A/P: 32Y [id number] @ [redacted]w[redacted]d, late term IOL Fetal wellbeing: cat I tracing IOL: s/p cytotec x 1, will start pitocin. Pelvis proven to 8lb 4oz. Last growth scan at [redacted]w[redacted]d showed EFW 80% (5lb15oz) with AC 91%. Leopolds 8.5lbs. Will monitor labor curve closely and plan shoulder precautions at delivery. Pain control: epidural upon patient request   Charlett Nose 01/09/2023, 11:51 AM

## 2023-01-09 NOTE — Anesthesia Preprocedure Evaluation (Signed)
Anesthesia Evaluation  Patient identified by MRN, date of birth, ID band Patient awake    Reviewed: Allergy & Precautions, NPO status , Patient's Chart, lab work & pertinent test results  History of Anesthesia Complications (+) Family history of anesthesia reaction  Airway Mallampati: III  TM Distance: >3 FB Neck ROM: Full    Dental   Pulmonary asthma    Pulmonary exam normal breath sounds clear to auscultation       Cardiovascular negative cardio ROS  Rhythm:Regular Rate:Normal     Neuro/Psych negative neurological ROS     GI/Hepatic negative GI ROS, Neg liver ROS,,,  Endo/Other  negative endocrine ROS    Renal/GU negative Renal ROS     Musculoskeletal scoliosis   Abdominal   Peds  Hematology negative hematology ROS (+) Lab Results      Component                Value               Date                      WBC                      12.4 (H)            01/09/2023                HGB                      11.1 (L)            01/09/2023                HCT                      33.0 (L)            01/09/2023                MCV                      86.2                01/09/2023                PLT                      344                 01/09/2023              Anesthesia Other Findings   Reproductive/Obstetrics (+) Pregnancy PCOS                             Anesthesia Physical Anesthesia Plan  ASA: 2  Anesthesia Plan: Epidural   Post-op Pain Management:    Induction:   PONV Risk Score and Plan:   Airway Management Planned:   Additional Equipment:   Intra-op Plan:   Post-operative Plan:   Informed Consent: I have reviewed the patients History and Physical, chart, labs and discussed the procedure including the risks, benefits and alternatives for the proposed anesthesia with the patient or authorized representative who has indicated his/her understanding and acceptance.        Plan Discussed with: Anesthesiologist  Anesthesia Plan Comments: (I have discussed risks of neuraxial  anesthesia including but not limited to infection, bleeding, nerve injury, back pain, headache, seizures, and failure of block. Patient denies bleeding disorders and is not currently anticoagulated. Labs have been reviewed. Risks and benefits discussed. All patient's questions answered.  )       Anesthesia Quick Evaluation

## 2023-01-09 NOTE — Progress Notes (Signed)
OB Progress Note  S: comfortable s/p epidural   O: Today's Vitals   01/09/23 1430 01/09/23 1431 01/09/23 1435 01/09/23 1500  BP: 111/76  114/74 100/70  Pulse: 76  68 71  Resp: 16  16 16   Temp:      TempSrc:      SpO2: 97%  96%   Weight:      Height:      PainSc:  0-No pain  0-No pain   Body mass index is 30.71 kg/m.  SVE 4/70/-2 AROM clear fluid  FHR: 130bpm, mod variability, + accels, no decels Toco: ctx q 2 mins   A/P:32Y [id number] @ [redacted]w[redacted]d, late term IOL Fetal wellbeing: cat I tracing IOL: s/p cytotec x 1, on pitocin (81mu/min), now s/p AROM. Pelvis proven to 8lb 4oz. Last growth scan at [redacted]w[redacted]d showed EFW 80% (5lb15oz) with AC 91%. Leopolds 8.5lbs. Will monitor labor curve closely and plan shoulder precautions at delivery. Pain control: epidural  M. Chael Urenda MD 01/09/23 3:14 PM

## 2023-01-10 LAB — CBC
HCT: 26.2 % — ABNORMAL LOW (ref 36.0–46.0)
Hemoglobin: 8.7 g/dL — ABNORMAL LOW (ref 12.0–15.0)
MCH: 29.7 pg (ref 26.0–34.0)
MCHC: 33.2 g/dL (ref 30.0–36.0)
MCV: 89.4 fL (ref 80.0–100.0)
Platelets: 282 10*3/uL (ref 150–400)
RBC: 2.93 MIL/uL — ABNORMAL LOW (ref 3.87–5.11)
RDW: 13.7 % (ref 11.5–15.5)
WBC: 14.4 10*3/uL — ABNORMAL HIGH (ref 4.0–10.5)
nRBC: 0 % (ref 0.0–0.2)

## 2023-01-10 MED ORDER — IBUPROFEN 800 MG PO TABS: 800.0000 mg | ORAL_TABLET | Freq: Three times a day (TID) | ORAL | 1 refills | Status: AC | PRN

## 2023-01-10 NOTE — Progress Notes (Signed)
POSTPARTUM PROGRESS NOTE  Post Partum Day #1  Subjective:  No acute events overnight.  Pt denies problems with ambulating, voiding or po intake.  She denies nausea or vomiting.  Pain is well controlled. Lochia Minimal. Baby boy s/p circ this morning, meatal findings reviewed, please see procedure note for details. Did not do Lovenox this pregnancy for thrombophilia, did not do pp Lovenox in last pregnancy  Objective: Blood pressure (!) 103/57, pulse 84, temperature 97.6 F (36.4 C), temperature source Oral, resp. rate 18, height 5\' 8"  (1.727 m), weight 91.6 kg, last menstrual period 03/28/2022, SpO2 98%, unknown if currently breastfeeding.  Physical Exam:  General: alert, cooperative and no distress Lochia:normal flow Chest: CTAB Heart: RRR no m/r/g Abdomen: +BS, soft, nontender Uterine Fundus: firm, 2cm below umbilicus Extremities: neg edema, neg calf TTP BL, neg Homans BL  Recent Labs    01/09/23 0730 01/10/23 0517  HGB 11.1* 8.7*  HCT 33.0* 26.2*    Assessment/Plan:  ASSESSMENT: Felicia Woodard is a 32 y.o. [id number] s/p SVD @ [redacted]w[redacted]d. PNC c/b PCOS,thrombophilia.   Discharge home and Breastfeeding Thrombophilia - declining pp Lovenox, amenable to baby ASA every day. Routine pp f/u Reviewed abnormal appearing (minimally) meatus for baby boy, peds urology f/u recommended   LOS: 1 day

## 2023-01-10 NOTE — Lactation Note (Addendum)
This note was copied from a baby's chart. Lactation Consultation Note Experienced BF mom declined Lactation services. Mom doesn't want to see Lactation.  Patient Name: Felicia Woodard [id number] Date: 01/10/2023 Age:31 hours     Maternal Data    Feeding    LATCH Score                    Lactation Tools Discussed/Used    Interventions    Discharge    Consult Status Consult Status: Complete    Kendyn Zaman G 01/10/2023, 4:14 AM

## 2023-01-10 NOTE — Discharge Instructions (Signed)
WHAT TO LOOK OUT FOR: Fever of 100.4 or above Mastitis: feels like flu and breasts hurt Infection: increased pain, swelling or redness Blood clots golf ball size or larger Postpartum depression   Congratulations on your newest addition!

## 2023-01-10 NOTE — Discharge Summary (Signed)
Postpartum Discharge Summary  Date of Service updated     Patient Name: Felicia Woodard DOB: [birth year] MRN: [id number]  Date of admission: 01/09/2023 Delivery date:01/09/2023 Delivering provider: Derl Barrow E Date of discharge: 01/10/2023  Admitting diagnosis: [redacted] weeks gestation of pregnancy [O48.0, Z3A.41] Intrauterine pregnancy: [redacted]w[redacted]d     Secondary diagnosis:  Principal Problem:   [redacted] weeks gestation of pregnancy  Additional problems: none    Discharge diagnosis: Term Pregnancy Delivered                                              Post partum procedures: none Augmentation: AROM, Pitocin, and Cytotec Complications: None  Hospital course: Induction of Labor With Vaginal Delivery   32 y.o. yo [id number] at [redacted]w[redacted]d was admitted to the hospital 01/09/2023 for induction of labor.  Indication for induction: Postdates.  Patient had an labor course complicated by none Membrane Rupture Time/Date: 3:00 PM,01/09/2023  Delivery Method:Vaginal, Spontaneous Operative Delivery:N/A Episiotomy: None Lacerations:  None Details of delivery can be found in separate delivery note.  Patient had a postpartum course complicated by none. Patient is discharged home 01/10/23.  Newborn Data: Birth date:01/09/2023 Birth time:5:38 PM Gender:Female Living status:Living Apgars:9 ,9  Weight:3750 g  Physical exam  Vitals:   01/09/23 2002 01/09/23 2100 01/10/23 0100 01/10/23 0533  BP: (!) 117/58 117/65 119/68 (!) 103/57  Pulse: 73 90 86 84  Resp: 18 18 18 18   Temp: 98.1 F (36.7 C) 98.5 F (36.9 C) 98.4 F (36.9 C) 97.6 F (36.4 C)  TempSrc: Oral Oral Oral Oral  SpO2: 100% 99% 100% 98%  Weight:      Height:       General: alert, cooperative, and no distress Lochia: appropriate Uterine Fundus: firm Incision: N/A DVT Evaluation: No evidence of DVT seen on physical exam. Negative Homan's sign. No cords or calf tenderness. Labs: Lab Results  Component Value Date   WBC 14.4 (H) 01/10/2023    HGB 8.7 (L) 01/10/2023   HCT 26.2 (L) 01/10/2023   MCV 89.4 01/10/2023   PLT 282 01/10/2023      Latest Ref Rng & Units 05/21/2018    2:33 PM  CMP  Glucose 65 - 99 mg/dL 84    Edinburgh Score:    01/09/2023    8:02 PM  Edinburgh Postnatal Depression Scale Screening Tool  I have been able to laugh and see the funny side of things. --      After visit meds:  Allergies as of 01/10/2023   No Known Allergies      Medication List     TAKE these medications    albuterol 108 (90 Base) MCG/ACT inhaler Commonly known as: VENTOLIN HFA Inhale 2 puffs into the lungs every 6 (six) hours as needed for wheezing or shortness of breath.   ALBUTEROL IN Inhale into the lungs as needed.   aspirin EC 81 MG tablet Take 81 mg by mouth daily. Swallow whole.   ibuprofen 800 MG tablet Commonly known as: ADVIL Take 1 tablet (800 mg total) by mouth every 8 (eight) hours as needed.   prenatal multivitamin Tabs tablet Take 1 tablet by mouth daily at 12 noon.         Discharge home in stable condition Infant Feeding: Breast Infant Disposition:home with mother Discharge instruction: per After Visit Summary and Postpartum booklet. Activity: Advance  as tolerated. Pelvic rest for 6 weeks.  Diet: routine diet Anticipated Birth Control: Unsure Postpartum Appointment:6 weeks Follow up Visit:[facility]   01/10/2023 Carlisle Cater, MD

## 2023-01-10 NOTE — Anesthesia Postprocedure Evaluation (Signed)
Anesthesia Post Note  Patient: Felicia Woodard  Procedure(s) Performed: AN AD HOC LABOR EPIDURAL     Patient location during evaluation: Mother Baby Anesthesia Type: Epidural Level of consciousness: awake and alert Pain management: pain level controlled Vital Signs Assessment: post-procedure vital signs reviewed and stable Respiratory status: spontaneous breathing, nonlabored ventilation and respiratory function stable Cardiovascular status: stable Postop Assessment: no headache, no backache and epidural receding Anesthetic complications: no   No notable events documented.  Last Vitals:  Vitals:   01/10/23 0100 01/10/23 0533  BP: 119/68 (!) 103/57  Pulse: 86 84  Resp: 18 18  Temp: 36.9 C 36.4 C  SpO2: 100% 98%    Last Pain:  Vitals:   01/10/23 0533  TempSrc: Oral  PainSc: 0-No pain   Pain Goal: Patients Stated Pain Goal: 0 (01/09/23 1600)                 Mitsuo Budnick

## 2023-01-13 LAB — TYPE AND SCREEN
ABO/RH(D): A POS
Antibody Screen: POSITIVE
Unit division: 0
Unit division: 0

## 2023-01-13 LAB — BPAM RBC
Blood Product Expiration Date: 202409172359
Blood Product Expiration Date: 202409172359
Unit Type and Rh: 6200
Unit Type and Rh: 6200

## 2023-02-09 ENCOUNTER — Telehealth (HOSPITAL_COMMUNITY): Payer: Self-pay

## 2023-02-09 NOTE — Telephone Encounter (Signed)
02/09/2023 1813  Name: Felicia Woodard MRN: [id number] DOB: [birth year]  Reason for Call:  Transition of Care Hospital Discharge Call  Contact Status: Patient Contact Status: Message  Language assistant needed: Interpreter Mode: Interpreter Not Needed        Follow-Up Questions:    Inocente Salles Postnatal Depression Scale:  In the Past 7 Days:    PHQ2-9 Depression Scale:     Discharge Follow-up:    Post-discharge interventions: NA  Signature  Signe Colt

## 2023-11-13 ENCOUNTER — Other Ambulatory Visit: Payer: Self-pay | Admitting: Orthopedic Surgery

## 2023-11-13 DIAGNOSIS — M25562 Pain in left knee: Secondary | ICD-10-CM

## 2023-11-16 ENCOUNTER — Ambulatory Visit
Admission: RE | Admit: 2023-11-16 | Discharge: 2023-11-16 | Disposition: A | Discharge status: Home / Self Care | Source: Ambulatory Visit | Attending: Orthopedic Surgery | Admitting: Orthopedic Surgery

## 2023-11-16 DIAGNOSIS — M25562 Pain in left knee: Secondary | ICD-10-CM
